# Patient Record
Sex: Female | Born: 1935 | Race: White | Hispanic: No | Smoking: Never smoker
Health system: Southern US, Community
[De-identification: ages and names within clinical notes are randomized; demographics above are authoritative.]

## PROBLEM LIST (undated history)

## (undated) DIAGNOSIS — F329 Major depressive disorder, single episode, unspecified: Secondary | ICD-10-CM

## (undated) DIAGNOSIS — C9000 Multiple myeloma not having achieved remission: Secondary | ICD-10-CM

## (undated) DIAGNOSIS — K219 Gastro-esophageal reflux disease without esophagitis: Secondary | ICD-10-CM

## (undated) DIAGNOSIS — F419 Anxiety disorder, unspecified: Secondary | ICD-10-CM

## (undated) DIAGNOSIS — E876 Hypokalemia: Secondary | ICD-10-CM

## (undated) DIAGNOSIS — J449 Chronic obstructive pulmonary disease, unspecified: Secondary | ICD-10-CM

## (undated) DIAGNOSIS — M8580 Other specified disorders of bone density and structure, unspecified site: Secondary | ICD-10-CM

## (undated) DIAGNOSIS — M1990 Unspecified osteoarthritis, unspecified site: Secondary | ICD-10-CM

## (undated) DIAGNOSIS — I4891 Unspecified atrial fibrillation: Secondary | ICD-10-CM

## (undated) DIAGNOSIS — R350 Frequency of micturition: Secondary | ICD-10-CM

## (undated) DIAGNOSIS — E785 Hyperlipidemia, unspecified: Secondary | ICD-10-CM

## (undated) DIAGNOSIS — F32A Depression, unspecified: Secondary | ICD-10-CM

## (undated) HISTORY — PX: THYROIDECTOMY, PARTIAL: SHX18

## (undated) HISTORY — DX: Hypokalemia: E87.6

## (undated) HISTORY — PX: TONSILLECTOMY: SUR1361

## (undated) HISTORY — DX: Major depressive disorder, single episode, unspecified: F32.9

## (undated) HISTORY — PX: APPENDECTOMY: SHX54

## (undated) HISTORY — DX: Other specified disorders of bone density and structure, unspecified site: M85.80

## (undated) HISTORY — DX: Depression, unspecified: F32.A

## (undated) HISTORY — DX: Hyperlipidemia, unspecified: E78.5

## (undated) HISTORY — DX: Unspecified atrial fibrillation: I48.91

## (undated) HISTORY — DX: Chronic obstructive pulmonary disease, unspecified: J44.9

---

## 2003-03-22 ENCOUNTER — Encounter: Admission: RE | Admit: 2003-03-22 | Discharge: 2003-03-22 | Payer: Self-pay

## 2009-03-07 ENCOUNTER — Ambulatory Visit: Payer: Self-pay | Admitting: Oncology

## 2009-03-09 LAB — CBC WITH DIFFERENTIAL/PLATELET
Basophils Absolute: 0 10*3/uL (ref 0.0–0.1)
EOS%: 2.1 % (ref 0.0–7.0)
HCT: 41.8 % (ref 34.8–46.6)
HGB: 13.9 g/dL (ref 11.6–15.9)
LYMPH%: 25.8 % (ref 14.0–49.7)
MCH: 28.9 pg (ref 25.1–34.0)
MCHC: 33.3 g/dL (ref 31.5–36.0)
MONO#: 0.7 10*3/uL (ref 0.1–0.9)
NEUT%: 63 % (ref 38.4–76.8)
Platelets: 276 10*3/uL (ref 145–400)
lymph#: 2.1 10*3/uL (ref 0.9–3.3)

## 2009-03-13 LAB — SPEP & IFE WITH QIG
Albumin ELP: 46.4 % — ABNORMAL LOW (ref 55.8–66.1)
Alpha-1-Globulin: 4 % (ref 2.9–4.9)
Beta 2: 4.6 % (ref 3.2–6.5)
Beta Globulin: 6 % (ref 4.7–7.2)
IgA: 334 mg/dL (ref 68–378)

## 2009-03-13 LAB — COMPREHENSIVE METABOLIC PANEL
Albumin: 3.9 g/dL (ref 3.5–5.2)
Alkaline Phosphatase: 70 U/L (ref 39–117)
BUN: 12 mg/dL (ref 6–23)
Creatinine, Ser: 0.62 mg/dL (ref 0.40–1.20)
Glucose, Bld: 129 mg/dL — ABNORMAL HIGH (ref 70–99)
Potassium: 3.7 mEq/L (ref 3.5–5.3)

## 2009-03-13 LAB — KAPPA/LAMBDA LIGHT CHAINS
Kappa free light chain: 4.18 mg/dL — ABNORMAL HIGH (ref 0.33–1.94)
Kappa:Lambda Ratio: 2.9 — ABNORMAL HIGH (ref 0.26–1.65)
Lambda Free Lght Chn: 1.44 mg/dL (ref 0.57–2.63)

## 2009-03-17 ENCOUNTER — Other Ambulatory Visit: Admission: RE | Admit: 2009-03-17 | Discharge: 2009-03-17 | Payer: Self-pay | Admitting: Oncology

## 2009-03-17 ENCOUNTER — Encounter: Payer: Self-pay | Admitting: Oncology

## 2009-03-17 ENCOUNTER — Ambulatory Visit (HOSPITAL_COMMUNITY): Admission: RE | Admit: 2009-03-17 | Discharge: 2009-03-17 | Payer: Self-pay | Admitting: Oncology

## 2009-03-17 LAB — PROTIME-INR: INR: 1.3 — ABNORMAL LOW (ref 2.00–3.50)

## 2009-03-17 LAB — CBC WITH DIFFERENTIAL/PLATELET
Basophils Absolute: 0 10*3/uL (ref 0.0–0.1)
EOS%: 1.7 % (ref 0.0–7.0)
Eosinophils Absolute: 0.2 10*3/uL (ref 0.0–0.5)
HCT: 39.2 % (ref 34.8–46.6)
HGB: 13.1 g/dL (ref 11.6–15.9)
MCH: 29 pg (ref 25.1–34.0)
MONO#: 1.2 10*3/uL — ABNORMAL HIGH (ref 0.1–0.9)
NEUT#: 6.8 10*3/uL — ABNORMAL HIGH (ref 1.5–6.5)
NEUT%: 62.9 % (ref 38.4–76.8)
RDW: 16.4 % — ABNORMAL HIGH (ref 11.2–14.5)
lymph#: 2.6 10*3/uL (ref 0.9–3.3)

## 2009-03-24 LAB — PROTIME-INR: INR: 1.4 — ABNORMAL LOW (ref 2.00–3.50)

## 2009-03-28 LAB — UIFE/LIGHT CHAINS/TP QN, 24-HR UR
Free Kappa Lt Chains,Ur: 8.82 mg/dL — ABNORMAL HIGH (ref 0.04–1.51)
Free Kappa/Lambda Ratio: 40.09 ratio — ABNORMAL HIGH (ref 0.46–4.00)
Free Lambda Lt Chains,Ur: 0.22 mg/dL (ref 0.08–1.01)
Free Lt Chn Excr Rate: 176.4 mg/d
Time: 24 hours
Total Protein, Urine-Ur/day: 194 mg/d — ABNORMAL HIGH (ref 10–140)
Total Protein, Urine: 9.7 mg/dL
Volume, Urine: 2000 mL

## 2009-03-30 LAB — PROTIME-INR

## 2009-04-24 ENCOUNTER — Ambulatory Visit: Payer: Self-pay | Admitting: Oncology

## 2009-04-28 LAB — CBC WITH DIFFERENTIAL/PLATELET
Basophils Absolute: 0 10*3/uL (ref 0.0–0.1)
EOS%: 2.1 % (ref 0.0–7.0)
HCT: 40.9 % (ref 34.8–46.6)
HGB: 14.1 g/dL (ref 11.6–15.9)
MCH: 30.3 pg (ref 25.1–34.0)
NEUT%: 60.9 % (ref 38.4–76.8)
Platelets: 262 10*3/uL (ref 145–400)
lymph#: 2 10*3/uL (ref 0.9–3.3)

## 2009-04-28 LAB — BASIC METABOLIC PANEL
BUN: 13 mg/dL (ref 6–23)
CO2: 24 mEq/L (ref 19–32)
Chloride: 102 mEq/L (ref 96–112)
Creatinine, Ser: 0.64 mg/dL (ref 0.40–1.20)

## 2009-05-04 ENCOUNTER — Encounter: Admission: RE | Admit: 2009-05-04 | Discharge: 2009-05-04 | Payer: Self-pay | Admitting: Oncology

## 2009-05-05 ENCOUNTER — Ambulatory Visit (HOSPITAL_COMMUNITY): Admission: RE | Admit: 2009-05-05 | Discharge: 2009-05-05 | Payer: Self-pay | Admitting: Oncology

## 2009-05-11 ENCOUNTER — Ambulatory Visit (HOSPITAL_COMMUNITY): Admission: RE | Admit: 2009-05-11 | Discharge: 2009-05-11 | Payer: Self-pay | Admitting: Oncology

## 2009-06-09 ENCOUNTER — Ambulatory Visit: Payer: Self-pay | Admitting: Oncology

## 2009-06-13 LAB — CBC WITH DIFFERENTIAL/PLATELET
Basophils Absolute: 0.1 10*3/uL (ref 0.0–0.1)
EOS%: 1.8 % (ref 0.0–7.0)
HCT: 41.1 % (ref 34.8–46.6)
HGB: 13.7 g/dL (ref 11.6–15.9)
LYMPH%: 27.4 % (ref 14.0–49.7)
MCH: 30 pg (ref 25.1–34.0)
MCHC: 33.3 g/dL (ref 31.5–36.0)
MCV: 90.1 fL (ref 79.5–101.0)
MONO%: 14.3 % — ABNORMAL HIGH (ref 0.0–14.0)
NEUT%: 55.7 % (ref 38.4–76.8)
Platelets: 227 10*3/uL (ref 145–400)

## 2009-06-13 LAB — COMPREHENSIVE METABOLIC PANEL
AST: 31 U/L (ref 0–37)
Alkaline Phosphatase: 62 U/L (ref 39–117)
BUN: 14 mg/dL (ref 6–23)
Calcium: 9 mg/dL (ref 8.4–10.5)
Chloride: 102 mEq/L (ref 96–112)
Creatinine, Ser: 0.66 mg/dL (ref 0.40–1.20)
Total Bilirubin: 0.5 mg/dL (ref 0.3–1.2)

## 2009-07-11 ENCOUNTER — Ambulatory Visit: Payer: Self-pay | Admitting: Oncology

## 2009-07-13 LAB — CBC WITH DIFFERENTIAL/PLATELET
Basophils Absolute: 0 10*3/uL (ref 0.0–0.1)
Eosinophils Absolute: 0.1 10*3/uL (ref 0.0–0.5)
HCT: 42 % (ref 34.8–46.6)
HGB: 14.3 g/dL (ref 11.6–15.9)
LYMPH%: 22.7 % (ref 14.0–49.7)
MCV: 92 fL (ref 79.5–101.0)
MONO#: 0.8 10*3/uL (ref 0.1–0.9)
MONO%: 8.7 % (ref 0.0–14.0)
NEUT#: 5.8 10*3/uL (ref 1.5–6.5)
Platelets: 267 10*3/uL (ref 145–400)
RBC: 4.56 10*6/uL (ref 3.70–5.45)
WBC: 8.7 10*3/uL (ref 3.9–10.3)

## 2009-07-18 LAB — COMPREHENSIVE METABOLIC PANEL
AST: 42 U/L — ABNORMAL HIGH (ref 0–37)
Albumin: 4 g/dL (ref 3.5–5.2)
Alkaline Phosphatase: 63 U/L (ref 39–117)
Calcium: 9 mg/dL (ref 8.4–10.5)
Chloride: 99 mEq/L (ref 96–112)
Potassium: 3.7 mEq/L (ref 3.5–5.3)
Sodium: 134 mEq/L — ABNORMAL LOW (ref 135–145)
Total Protein: 9.2 g/dL — ABNORMAL HIGH (ref 6.0–8.3)

## 2009-07-18 LAB — SPEP & IFE WITH QIG
Albumin ELP: 43.7 % — ABNORMAL LOW (ref 55.8–66.1)
Alpha-2-Globulin: 11.6 % (ref 7.1–11.8)
IgA: 277 mg/dL (ref 68–378)
IgG (Immunoglobin G), Serum: 3950 mg/dL — ABNORMAL HIGH (ref 694–1618)
M-Spike, %: 2.42 g/dL
Total Protein, Serum Electrophoresis: 9.2 g/dL — ABNORMAL HIGH (ref 6.0–8.3)

## 2009-08-02 LAB — CBC WITH DIFFERENTIAL/PLATELET
BASO%: 0.3 % (ref 0.0–2.0)
EOS%: 1.6 % (ref 0.0–7.0)
Eosinophils Absolute: 0.2 10*3/uL (ref 0.0–0.5)
LYMPH%: 20.8 % (ref 14.0–49.7)
MCHC: 33.9 g/dL (ref 31.5–36.0)
MCV: 91.8 fL (ref 79.5–101.0)
MONO%: 9.5 % (ref 0.0–14.0)
NEUT#: 6.7 10*3/uL — ABNORMAL HIGH (ref 1.5–6.5)
Platelets: 261 10*3/uL (ref 145–400)
RBC: 4.43 10*6/uL (ref 3.70–5.45)
lymph#: 2.1 10*3/uL (ref 0.9–3.3)

## 2009-08-04 LAB — PROTEIN ELECTROPHORESIS, SERUM
Alpha-2-Globulin: 10.8 % (ref 7.1–11.8)
Beta 2: 3.4 % (ref 3.2–6.5)
M-Spike, %: 2.64 g/dL
Total Protein, Serum Electrophoresis: 9.6 g/dL — ABNORMAL HIGH (ref 6.0–8.3)

## 2009-08-04 LAB — COMPREHENSIVE METABOLIC PANEL
Alkaline Phosphatase: 62 U/L (ref 39–117)
Chloride: 98 mEq/L (ref 96–112)
Creatinine, Ser: 0.58 mg/dL (ref 0.40–1.20)
Glucose, Bld: 124 mg/dL — ABNORMAL HIGH (ref 70–99)
Potassium: 3.6 mEq/L (ref 3.5–5.3)
Sodium: 133 mEq/L — ABNORMAL LOW (ref 135–145)
Total Bilirubin: 0.5 mg/dL (ref 0.3–1.2)

## 2009-08-04 LAB — KAPPA/LAMBDA LIGHT CHAINS
Kappa free light chain: 6.22 mg/dL — ABNORMAL HIGH (ref 0.33–1.94)
Kappa:Lambda Ratio: 6.04 — ABNORMAL HIGH (ref 0.26–1.65)

## 2009-09-11 ENCOUNTER — Ambulatory Visit: Payer: Self-pay | Admitting: Oncology

## 2009-09-11 LAB — CBC WITH DIFFERENTIAL/PLATELET
BASO%: 0.5 % (ref 0.0–2.0)
EOS%: 3.9 % (ref 0.0–7.0)
MCH: 31.8 pg (ref 25.1–34.0)
MCHC: 34 g/dL (ref 31.5–36.0)
MONO%: 19.2 % — ABNORMAL HIGH (ref 0.0–14.0)
RDW: 14.6 % — ABNORMAL HIGH (ref 11.2–14.5)
lymph#: 1.5 10*3/uL (ref 0.9–3.3)

## 2009-09-13 LAB — COMPREHENSIVE METABOLIC PANEL
ALT: 36 U/L — ABNORMAL HIGH (ref 0–35)
AST: 22 U/L (ref 0–37)
Albumin: 3.6 g/dL (ref 3.5–5.2)
Alkaline Phosphatase: 61 U/L (ref 39–117)
Calcium: 8.8 mg/dL (ref 8.4–10.5)
Chloride: 97 mEq/L (ref 96–112)
Creatinine, Ser: 0.68 mg/dL (ref 0.40–1.20)
Potassium: 3.6 mEq/L (ref 3.5–5.3)

## 2009-09-13 LAB — KAPPA/LAMBDA LIGHT CHAINS: Kappa:Lambda Ratio: 0.6 (ref 0.26–1.65)

## 2009-09-13 LAB — PROTEIN ELECTROPHORESIS, SERUM
Albumin ELP: 42.4 % — ABNORMAL LOW (ref 55.8–66.1)
Alpha-2-Globulin: 18.1 % — ABNORMAL HIGH (ref 7.1–11.8)
Beta 2: 6.2 % (ref 3.2–6.5)
Beta Globulin: 7.1 % (ref 4.7–7.2)

## 2009-10-09 LAB — CBC WITH DIFFERENTIAL/PLATELET
BASO%: 0.1 % (ref 0.0–2.0)
EOS%: 2.2 % (ref 0.0–7.0)
LYMPH%: 8.2 % — ABNORMAL LOW (ref 14.0–49.7)
NEUT%: 82.9 % — ABNORMAL HIGH (ref 38.4–76.8)
Platelets: 253 10*3/uL (ref 145–400)
RBC: 4.61 10*6/uL (ref 3.70–5.45)
RDW: 15.1 % — ABNORMAL HIGH (ref 11.2–14.5)
WBC: 6.4 10*3/uL (ref 3.9–10.3)

## 2009-10-11 ENCOUNTER — Ambulatory Visit: Payer: Self-pay | Admitting: Oncology

## 2009-10-11 LAB — IGG, IGA, IGM
IgA: 369 mg/dL (ref 68–378)
IgG (Immunoglobin G), Serum: 829 mg/dL (ref 694–1618)

## 2009-10-11 LAB — KAPPA/LAMBDA LIGHT CHAINS: Lambda Free Lght Chn: 1.89 mg/dL (ref 0.57–2.63)

## 2009-10-11 LAB — PROTEIN ELECTROPHORESIS, SERUM
Beta Globulin: 7.6 % — ABNORMAL HIGH (ref 4.7–7.2)
Gamma Globulin: 12.2 % (ref 11.1–18.8)
Total Protein, Serum Electrophoresis: 6.7 g/dL (ref 6.0–8.3)

## 2009-10-11 LAB — COMPREHENSIVE METABOLIC PANEL
AST: 21 U/L (ref 0–37)
Albumin: 3.8 g/dL (ref 3.5–5.2)
Alkaline Phosphatase: 57 U/L (ref 39–117)
BUN: 9 mg/dL (ref 6–23)
Chloride: 95 mEq/L — ABNORMAL LOW (ref 96–112)
Potassium: 3.4 mEq/L — ABNORMAL LOW (ref 3.5–5.3)
Total Bilirubin: 0.7 mg/dL (ref 0.3–1.2)
Total Protein: 6.7 g/dL (ref 6.0–8.3)

## 2009-11-06 LAB — MANUAL DIFFERENTIAL
ALC: 1.5 10*3/uL (ref 0.9–3.3)
ANC (CHCC manual diff): 4.1 10*3/uL (ref 1.5–6.5)
Blasts: 0 % (ref 0–0)
LYMPH: 18 % (ref 14–49)
MONO: 31 % — ABNORMAL HIGH (ref 0–14)
Myelocytes: 0 % (ref 0–0)
Other Cell: 0 % (ref 0–0)
SEG: 49 % (ref 38–77)

## 2009-11-06 LAB — CBC WITH DIFFERENTIAL/PLATELET
HCT: 38.4 % (ref 34.8–46.6)
HGB: 13.2 g/dL (ref 11.6–15.9)
MCHC: 34.4 g/dL (ref 31.5–36.0)
Platelets: 278 10*3/uL (ref 145–400)
WBC: 8.4 10*3/uL (ref 3.9–10.3)

## 2009-11-08 LAB — KAPPA/LAMBDA LIGHT CHAINS
Kappa free light chain: 0.66 mg/dL (ref 0.33–1.94)
Kappa:Lambda Ratio: 0.54 (ref 0.26–1.65)
Lambda Free Lght Chn: 1.22 mg/dL (ref 0.57–2.63)

## 2009-11-08 LAB — COMPREHENSIVE METABOLIC PANEL WITH GFR
ALT: 42 U/L — ABNORMAL HIGH (ref 0–35)
AST: 32 U/L (ref 0–37)
Albumin: 3.4 g/dL — ABNORMAL LOW (ref 3.5–5.2)
Alkaline Phosphatase: 49 U/L (ref 39–117)
BUN: 15 mg/dL (ref 6–23)
CO2: 29 meq/L (ref 19–32)
Calcium: 8 mg/dL — ABNORMAL LOW (ref 8.4–10.5)
Chloride: 97 meq/L (ref 96–112)
Creatinine, Ser: 0.67 mg/dL (ref 0.40–1.20)
Glucose, Bld: 135 mg/dL — ABNORMAL HIGH (ref 70–99)
Potassium: 2.9 meq/L — ABNORMAL LOW (ref 3.5–5.3)
Sodium: 136 meq/L (ref 135–145)
Total Bilirubin: 0.6 mg/dL (ref 0.3–1.2)
Total Protein: 6.1 g/dL (ref 6.0–8.3)

## 2009-11-08 LAB — IGG, IGA, IGM
IgA: 265 mg/dL (ref 68–378)
IgG (Immunoglobin G), Serum: 591 mg/dL — ABNORMAL LOW (ref 694–1618)
IgM, Serum: 68 mg/dL (ref 60–263)

## 2009-11-08 LAB — PROTEIN ELECTROPHORESIS, SERUM
Albumin ELP: 49.6 % — ABNORMAL LOW (ref 55.8–66.1)
Alpha-1-Globulin: 10.8 % — ABNORMAL HIGH (ref 2.9–4.9)
Beta Globulin: 7 % (ref 4.7–7.2)
Gamma Globulin: 10.2 % — ABNORMAL LOW (ref 11.1–18.8)
M-Spike, %: 0.18 g/dL
Total Protein, Serum Electrophoresis: 6.1 g/dL (ref 6.0–8.3)

## 2009-11-10 ENCOUNTER — Ambulatory Visit: Payer: Self-pay | Admitting: Oncology

## 2009-12-04 LAB — CBC WITH DIFFERENTIAL/PLATELET
EOS%: 4.3 % (ref 0.0–7.0)
Eosinophils Absolute: 0.4 10*3/uL (ref 0.0–0.5)
HGB: 14.1 g/dL (ref 11.6–15.9)
MCV: 93.3 fL (ref 79.5–101.0)
MONO%: 18.9 % — ABNORMAL HIGH (ref 0.0–14.0)
NEUT#: 5.1 10*3/uL (ref 1.5–6.5)
Platelets: 214 10*3/uL (ref 145–400)
RDW: 17.1 % — ABNORMAL HIGH (ref 11.2–14.5)

## 2009-12-08 LAB — COMPREHENSIVE METABOLIC PANEL
ALT: 44 U/L — ABNORMAL HIGH (ref 0–35)
Albumin: 3.6 g/dL (ref 3.5–5.2)
BUN: 12 mg/dL (ref 6–23)
Chloride: 98 mEq/L (ref 96–112)
Total Protein: 5.9 g/dL — ABNORMAL LOW (ref 6.0–8.3)

## 2009-12-08 LAB — PROTEIN ELECTROPHORESIS, SERUM
Albumin ELP: 55.2 % — ABNORMAL LOW (ref 55.8–66.1)
Alpha-2-Globulin: 18.4 % — ABNORMAL HIGH (ref 7.1–11.8)
Beta 2: 5.7 % (ref 3.2–6.5)
Beta Globulin: 7.1 % (ref 4.7–7.2)

## 2009-12-08 LAB — KAPPA/LAMBDA LIGHT CHAINS
Kappa free light chain: <0.54 mg/dL (ref 0.33–1.94)
Lambda Free Lght Chn: 0.61 mg/dL (ref 0.57–2.63)

## 2009-12-28 ENCOUNTER — Ambulatory Visit: Payer: Self-pay | Admitting: Oncology

## 2010-01-01 LAB — CBC WITH DIFFERENTIAL/PLATELET
BASO%: 0.1 % (ref 0.0–2.0)
HGB: 14 g/dL (ref 11.6–15.9)
LYMPH%: 16.3 % (ref 14.0–49.7)
MONO#: 2.3 10*3/uL — ABNORMAL HIGH (ref 0.1–0.9)
NEUT#: 4.2 10*3/uL (ref 1.5–6.5)
Platelets: 230 10*3/uL (ref 145–400)
RBC: 4.46 10*6/uL (ref 3.70–5.45)
RDW: 15.8 % — ABNORMAL HIGH (ref 11.2–14.5)

## 2010-01-03 LAB — PROTEIN ELECTROPHORESIS, SERUM
Alpha-2-Globulin: 19.3 % — ABNORMAL HIGH (ref 7.1–11.8)
Beta 2: 5.5 % (ref 3.2–6.5)
Beta Globulin: 7.2 % (ref 4.7–7.2)
Gamma Globulin: 7.9 % — ABNORMAL LOW (ref 11.1–18.8)

## 2010-01-03 LAB — KAPPA/LAMBDA LIGHT CHAINS
Kappa free light chain: 0.58 mg/dL (ref 0.33–1.94)
Lambda Free Lght Chn: 2.22 mg/dL (ref 0.57–2.63)

## 2010-01-03 LAB — COMPREHENSIVE METABOLIC PANEL
ALT: 44 U/L — ABNORMAL HIGH (ref 0–35)
Albumin: 3.6 g/dL (ref 3.5–5.2)
CO2: 27 mEq/L (ref 19–32)
Calcium: 8.4 mg/dL (ref 8.4–10.5)
Creatinine, Ser: 0.57 mg/dL (ref 0.40–1.20)
Sodium: 137 mEq/L (ref 135–145)
Total Protein: 6.2 g/dL (ref 6.0–8.3)

## 2010-01-29 ENCOUNTER — Ambulatory Visit: Payer: Self-pay | Admitting: Oncology

## 2010-01-29 LAB — CBC WITH DIFFERENTIAL/PLATELET
BASO%: 0.7 % (ref 0.0–2.0)
Basophils Absolute: 0 10*3/uL (ref 0.0–0.1)
EOS%: 4 % (ref 0.0–7.0)
Eosinophils Absolute: 0.3 10*3/uL (ref 0.0–0.5)
LYMPH%: 28 % (ref 14.0–49.7)
MCH: 31.8 pg (ref 25.1–34.0)
MONO#: 1.3 10*3/uL — ABNORMAL HIGH (ref 0.1–0.9)
MONO%: 19.6 % — ABNORMAL HIGH (ref 0.0–14.0)
NEUT#: 3.1 10*3/uL (ref 1.5–6.5)
NEUT%: 47.7 % (ref 38.4–76.8)
WBC: 6.4 10*3/uL (ref 3.9–10.3)
lymph#: 1.8 10*3/uL (ref 0.9–3.3)

## 2010-01-31 LAB — COMPREHENSIVE METABOLIC PANEL
ALT: 17 U/L (ref 0–35)
AST: 20 U/L (ref 0–37)
Alkaline Phosphatase: 52 U/L (ref 39–117)
Creatinine, Ser: 0.6 mg/dL (ref 0.40–1.20)
Potassium: 3.2 mEq/L — ABNORMAL LOW (ref 3.5–5.3)
Sodium: 141 mEq/L (ref 135–145)
Total Bilirubin: 0.7 mg/dL (ref 0.3–1.2)

## 2010-01-31 LAB — PROTEIN ELECTROPHORESIS, SERUM
Alpha-2-Globulin: 16.5 % — ABNORMAL HIGH (ref 7.1–11.8)
Beta 2: 6.9 % — ABNORMAL HIGH (ref 3.2–6.5)
Beta Globulin: 7.4 % — ABNORMAL HIGH (ref 4.7–7.2)
Gamma Globulin: 12 % (ref 11.1–18.8)
Total Protein, Serum Electrophoresis: 6.3 g/dL (ref 6.0–8.3)

## 2010-01-31 LAB — KAPPA/LAMBDA LIGHT CHAINS: Lambda Free Lght Chn: 1.35 mg/dL (ref 0.57–2.63)

## 2010-02-02 ENCOUNTER — Ambulatory Visit (HOSPITAL_COMMUNITY): Admission: RE | Admit: 2010-02-02 | Discharge: 2010-02-02 | Payer: Self-pay | Admitting: Oncology

## 2010-02-26 LAB — CBC WITH DIFFERENTIAL/PLATELET
BASO%: 0.4 % (ref 0.0–2.0)
Basophils Absolute: 0 10*3/uL (ref 0.0–0.1)
Eosinophils Absolute: 0.3 10*3/uL (ref 0.0–0.5)
HGB: 13.8 g/dL (ref 11.6–15.9)
LYMPH%: 27.1 % (ref 14.0–49.7)
MCV: 90.2 fL (ref 79.5–101.0)
MONO%: 19.5 % — ABNORMAL HIGH (ref 0.0–14.0)
NEUT#: 3.6 10*3/uL (ref 1.5–6.5)
NEUT%: 48.8 % (ref 38.4–76.8)
Platelets: 212 10*3/uL (ref 145–400)
RDW: 14.9 % — ABNORMAL HIGH (ref 11.2–14.5)

## 2010-02-28 ENCOUNTER — Ambulatory Visit: Payer: Self-pay | Admitting: Oncology

## 2010-03-02 LAB — COMPREHENSIVE METABOLIC PANEL
ALT: 12 U/L (ref 0–35)
AST: 17 U/L (ref 0–37)
Albumin: 3.6 g/dL (ref 3.5–5.2)
Alkaline Phosphatase: 47 U/L (ref 39–117)
BUN: 12 mg/dL (ref 6–23)
Creatinine, Ser: 0.65 mg/dL (ref 0.40–1.20)
Total Protein: 6.3 g/dL (ref 6.0–8.3)

## 2010-03-02 LAB — PROTEIN ELECTROPHORESIS, SERUM
Albumin ELP: 48.9 % — ABNORMAL LOW (ref 55.8–66.1)
Alpha-2-Globulin: 17.5 % — ABNORMAL HIGH (ref 7.1–11.8)
Beta 2: 6 % (ref 3.2–6.5)
Gamma Globulin: 13.1 % (ref 11.1–18.8)

## 2010-03-02 LAB — KAPPA/LAMBDA LIGHT CHAINS: Kappa:Lambda Ratio: 0.7 (ref 0.26–1.65)

## 2010-03-26 LAB — CBC WITH DIFFERENTIAL/PLATELET
Basophils Absolute: 0 10*3/uL (ref 0.0–0.1)
LYMPH%: 22.7 % (ref 14.0–49.7)
NEUT#: 4.2 10*3/uL (ref 1.5–6.5)
RBC: 4.28 10*6/uL (ref 3.70–5.45)
lymph#: 1.7 10*3/uL (ref 0.9–3.3)

## 2010-03-28 LAB — KAPPA/LAMBDA LIGHT CHAINS
Kappa free light chain: 1.97 mg/dL — ABNORMAL HIGH (ref 0.33–1.94)
Kappa:Lambda Ratio: 0.89 (ref 0.26–1.65)
Lambda Free Lght Chn: 2.22 mg/dL (ref 0.57–2.63)

## 2010-03-28 LAB — COMPREHENSIVE METABOLIC PANEL
ALT: 12 U/L (ref 0–35)
Albumin: 3.7 g/dL (ref 3.5–5.2)
BUN: 8 mg/dL (ref 6–23)
CO2: 26 mEq/L (ref 19–32)
Creatinine, Ser: 0.72 mg/dL (ref 0.40–1.20)
Glucose, Bld: 79 mg/dL (ref 70–99)
Potassium: 3.3 mEq/L — ABNORMAL LOW (ref 3.5–5.3)
Sodium: 138 mEq/L (ref 135–145)
Total Bilirubin: 0.8 mg/dL (ref 0.3–1.2)
Total Protein: 6.5 g/dL (ref 6.0–8.3)

## 2010-03-28 LAB — PROTEIN ELECTROPHORESIS, SERUM
Albumin ELP: 47.9 % — ABNORMAL LOW (ref 55.8–66.1)
Alpha-1-Globulin: 6.3 % — ABNORMAL HIGH (ref 2.9–4.9)
Alpha-2-Globulin: 14.6 % — ABNORMAL HIGH (ref 7.1–11.8)
Beta 2: 7.1 % — ABNORMAL HIGH (ref 3.2–6.5)
Gamma Globulin: 17.5 % (ref 11.1–18.8)
Total Protein, Serum Electrophoresis: 6.9 g/dL (ref 6.0–8.3)

## 2010-03-30 ENCOUNTER — Ambulatory Visit: Payer: Self-pay | Admitting: Oncology

## 2010-04-23 LAB — CBC WITH DIFFERENTIAL/PLATELET
BASO%: 0.4 % (ref 0.0–2.0)
EOS%: 4.5 % (ref 0.0–7.0)
HCT: 37.7 % (ref 34.8–46.6)
HGB: 12.7 g/dL (ref 11.6–15.9)
MONO#: 1.2 10*3/uL — ABNORMAL HIGH (ref 0.1–0.9)
MONO%: 19.1 % — ABNORMAL HIGH (ref 0.0–14.0)
NEUT#: 3 10*3/uL (ref 1.5–6.5)

## 2010-04-25 LAB — PROTEIN ELECTROPHORESIS, SERUM
Albumin ELP: 45.6 % — ABNORMAL LOW (ref 55.8–66.1)
Alpha-2-Globulin: 15.1 % — ABNORMAL HIGH (ref 7.1–11.8)
Beta Globulin: 6.5 % (ref 4.7–7.2)
Gamma Globulin: 19.6 % — ABNORMAL HIGH (ref 11.1–18.8)
Total Protein, Serum Electrophoresis: 6.9 g/dL (ref 6.0–8.3)

## 2010-04-25 LAB — COMPREHENSIVE METABOLIC PANEL
ALT: 12 U/L (ref 0–35)
Albumin: 3.7 g/dL (ref 3.5–5.2)
BUN: 9 mg/dL (ref 6–23)
Potassium: 3.3 mEq/L — ABNORMAL LOW (ref 3.5–5.3)
Sodium: 137 mEq/L (ref 135–145)
Total Bilirubin: 0.5 mg/dL (ref 0.3–1.2)
Total Protein: 6.9 g/dL (ref 6.0–8.3)

## 2010-05-17 ENCOUNTER — Ambulatory Visit: Payer: Self-pay | Admitting: Oncology

## 2010-05-21 LAB — CBC WITH DIFFERENTIAL/PLATELET
Basophils Absolute: 0 10*3/uL (ref 0.0–0.1)
EOS%: 4.4 % (ref 0.0–7.0)
Eosinophils Absolute: 0.3 10*3/uL (ref 0.0–0.5)
HCT: 38.6 % (ref 34.8–46.6)
HGB: 12.7 g/dL (ref 11.6–15.9)
MCH: 28.5 pg (ref 25.1–34.0)
MONO#: 1.3 10*3/uL — ABNORMAL HIGH (ref 0.1–0.9)
NEUT#: 3.2 10*3/uL (ref 1.5–6.5)
NEUT%: 45.8 % (ref 38.4–76.8)
RDW: 17.1 % — ABNORMAL HIGH (ref 11.2–14.5)
lymph#: 2.1 10*3/uL (ref 0.9–3.3)

## 2010-05-23 LAB — COMPREHENSIVE METABOLIC PANEL
Albumin: 3.8 g/dL (ref 3.5–5.2)
BUN: 11 mg/dL (ref 6–23)
CO2: 26 mEq/L (ref 19–32)
Calcium: 8.9 mg/dL (ref 8.4–10.5)
Chloride: 100 mEq/L (ref 96–112)
Glucose, Bld: 91 mg/dL (ref 70–99)
Potassium: 3.4 mEq/L — ABNORMAL LOW (ref 3.5–5.3)
Sodium: 137 mEq/L (ref 135–145)
Total Protein: 6.9 g/dL (ref 6.0–8.3)

## 2010-05-23 LAB — PROTEIN ELECTROPHORESIS, SERUM
Albumin ELP: 46.8 % — ABNORMAL LOW (ref 55.8–66.1)
Alpha-1-Globulin: 5.5 % — ABNORMAL HIGH (ref 2.9–4.9)
Beta 2: 7.1 % — ABNORMAL HIGH (ref 3.2–6.5)
Total Protein, Serum Electrophoresis: 6.9 g/dL (ref 6.0–8.3)

## 2010-05-23 LAB — KAPPA/LAMBDA LIGHT CHAINS
Kappa free light chain: 2.14 mg/dL — ABNORMAL HIGH (ref 0.33–1.94)
Kappa:Lambda Ratio: 1.24 (ref 0.26–1.65)

## 2010-06-18 ENCOUNTER — Ambulatory Visit: Payer: Self-pay | Admitting: Oncology

## 2010-07-17 LAB — CBC WITH DIFFERENTIAL/PLATELET
BASO%: 0.6 % (ref 0.0–2.0)
EOS%: 4 % (ref 0.0–7.0)
Eosinophils Absolute: 0.2 10*3/uL (ref 0.0–0.5)
HCT: 37.8 % (ref 34.8–46.6)
LYMPH%: 32.6 % (ref 14.0–49.7)
MONO#: 1 10*3/uL — ABNORMAL HIGH (ref 0.1–0.9)
NEUT#: 2.2 10*3/uL (ref 1.5–6.5)
NEUT%: 43.7 % (ref 38.4–76.8)
Platelets: 183 10*3/uL (ref 145–400)
RBC: 4.23 10*6/uL (ref 3.70–5.45)
lymph#: 1.7 10*3/uL (ref 0.9–3.3)

## 2010-07-19 LAB — KAPPA/LAMBDA LIGHT CHAINS
Kappa free light chain: 3.4 mg/dL — ABNORMAL HIGH (ref 0.33–1.94)
Kappa:Lambda Ratio: 1.85 — ABNORMAL HIGH (ref 0.26–1.65)
Lambda Free Lght Chn: 1.84 mg/dL (ref 0.57–2.63)

## 2010-07-19 LAB — COMPREHENSIVE METABOLIC PANEL
ALT: 13 U/L (ref 0–35)
AST: 22 U/L (ref 0–37)
Albumin: 3.9 g/dL (ref 3.5–5.2)
Alkaline Phosphatase: 54 U/L (ref 39–117)
BUN: 11 mg/dL (ref 6–23)
CO2: 26 mEq/L (ref 19–32)
Calcium: 8.8 mg/dL (ref 8.4–10.5)
Chloride: 101 mEq/L (ref 96–112)
Creatinine, Ser: 0.69 mg/dL (ref 0.40–1.20)
Glucose, Bld: 108 mg/dL — ABNORMAL HIGH (ref 70–99)
Potassium: 3.3 mEq/L — ABNORMAL LOW (ref 3.5–5.3)
Sodium: 138 mEq/L (ref 135–145)
Total Bilirubin: 0.6 mg/dL (ref 0.3–1.2)
Total Protein: 7.1 g/dL (ref 6.0–8.3)

## 2010-07-19 LAB — PROTEIN ELECTROPHORESIS, SERUM
Albumin ELP: 50 % — ABNORMAL LOW (ref 55.8–66.1)
Alpha-1-Globulin: 4.9 % (ref 2.9–4.9)
Alpha-2-Globulin: 12.5 % — ABNORMAL HIGH (ref 7.1–11.8)
Beta 2: 7 % — ABNORMAL HIGH (ref 3.2–6.5)
Beta Globulin: 7.1 % (ref 4.7–7.2)
Gamma Globulin: 18.5 % (ref 11.1–18.8)
Total Protein, Serum Electrophoresis: 7.1 g/dL (ref 6.0–8.3)

## 2010-07-20 ENCOUNTER — Ambulatory Visit: Payer: Self-pay | Admitting: Oncology

## 2010-08-13 LAB — CBC WITH DIFFERENTIAL/PLATELET
BASO%: 0.4 % (ref 0.0–2.0)
Basophils Absolute: 0 10*3/uL (ref 0.0–0.1)
Eosinophils Absolute: 0.2 10*3/uL (ref 0.0–0.5)
HGB: 13.2 g/dL (ref 11.6–15.9)
MCHC: 33.4 g/dL (ref 31.5–36.0)
MCV: 89.6 fL (ref 79.5–101.0)
MONO#: 1.2 10*3/uL — ABNORMAL HIGH (ref 0.1–0.9)
NEUT#: 2.9 10*3/uL (ref 1.5–6.5)
Platelets: 166 10*3/uL (ref 145–400)
RBC: 4.41 10*6/uL (ref 3.70–5.45)
lymph#: 1.7 10*3/uL (ref 0.9–3.3)

## 2010-08-16 LAB — COMPREHENSIVE METABOLIC PANEL
AST: 21 U/L (ref 0–37)
CO2: 24 mEq/L (ref 19–32)
Calcium: 8.6 mg/dL (ref 8.4–10.5)
Glucose, Bld: 87 mg/dL (ref 70–99)
Sodium: 136 mEq/L (ref 135–145)
Total Bilirubin: 0.7 mg/dL (ref 0.3–1.2)
Total Protein: 6.9 g/dL (ref 6.0–8.3)

## 2010-08-16 LAB — KAPPA/LAMBDA LIGHT CHAINS
Kappa free light chain: 2.3 mg/dL — ABNORMAL HIGH (ref 0.33–1.94)
Kappa:Lambda Ratio: 1.65 (ref 0.26–1.65)
Lambda Free Lght Chn: 1.39 mg/dL (ref 0.57–2.63)

## 2010-08-16 LAB — PROTEIN ELECTROPHORESIS, SERUM
Albumin ELP: 48.8 % — ABNORMAL LOW (ref 55.8–66.1)
Total Protein, Serum Electrophoresis: 6.9 g/dL (ref 6.0–8.3)

## 2010-08-16 LAB — LACTATE DEHYDROGENASE: LDH: 115 U/L (ref 94–250)

## 2010-08-22 ENCOUNTER — Encounter (HOSPITAL_BASED_OUTPATIENT_CLINIC_OR_DEPARTMENT_OTHER): Payer: Medicare Other | Admitting: Oncology

## 2010-08-22 DIAGNOSIS — E876 Hypokalemia: Secondary | ICD-10-CM

## 2010-08-22 DIAGNOSIS — M899 Disorder of bone, unspecified: Secondary | ICD-10-CM

## 2010-08-22 DIAGNOSIS — C9000 Multiple myeloma not having achieved remission: Secondary | ICD-10-CM

## 2010-09-10 ENCOUNTER — Other Ambulatory Visit: Payer: Self-pay | Admitting: Oncology

## 2010-09-10 ENCOUNTER — Encounter (HOSPITAL_BASED_OUTPATIENT_CLINIC_OR_DEPARTMENT_OTHER): Payer: Medicare Other | Admitting: Oncology

## 2010-09-10 DIAGNOSIS — C9000 Multiple myeloma not having achieved remission: Secondary | ICD-10-CM

## 2010-09-10 DIAGNOSIS — E876 Hypokalemia: Secondary | ICD-10-CM

## 2010-09-10 DIAGNOSIS — M949 Disorder of cartilage, unspecified: Secondary | ICD-10-CM

## 2010-09-10 LAB — CBC WITH DIFFERENTIAL/PLATELET
BASO%: 0.4 % (ref 0.0–2.0)
EOS%: 5.1 % (ref 0.0–7.0)
HCT: 38.9 % (ref 34.8–46.6)
LYMPH%: 34.1 % (ref 14.0–49.7)
MCH: 29.7 pg (ref 25.1–34.0)
MCHC: 33.1 g/dL (ref 31.5–36.0)
MONO#: 1.1 10*3/uL — ABNORMAL HIGH (ref 0.1–0.9)
NEUT%: 42 % (ref 38.4–76.8)
Platelets: 142 10*3/uL — ABNORMAL LOW (ref 145–400)
RBC: 4.34 10*6/uL (ref 3.70–5.45)
WBC: 5.8 10*3/uL (ref 3.9–10.3)
lymph#: 2 10*3/uL (ref 0.9–3.3)

## 2010-09-12 LAB — PROTEIN ELECTROPHORESIS, SERUM
Albumin ELP: 51.9 % — ABNORMAL LOW (ref 55.8–66.1)
Alpha-1-Globulin: 4.9 % (ref 2.9–4.9)
Alpha-2-Globulin: 12.7 % — ABNORMAL HIGH (ref 7.1–11.8)
Beta Globulin: 6.5 % (ref 4.7–7.2)
Total Protein, Serum Electrophoresis: 7 g/dL (ref 6.0–8.3)

## 2010-09-12 LAB — COMPREHENSIVE METABOLIC PANEL
ALT: 13 U/L (ref 0–35)
BUN: 11 mg/dL (ref 6–23)
CO2: 25 mEq/L (ref 19–32)
Creatinine, Ser: 0.76 mg/dL (ref 0.40–1.20)
Glucose, Bld: 101 mg/dL — ABNORMAL HIGH (ref 70–99)
Total Bilirubin: 0.6 mg/dL (ref 0.3–1.2)

## 2010-10-08 ENCOUNTER — Encounter (HOSPITAL_BASED_OUTPATIENT_CLINIC_OR_DEPARTMENT_OTHER): Payer: Medicare Other | Admitting: Oncology

## 2010-10-08 ENCOUNTER — Other Ambulatory Visit: Payer: Self-pay | Admitting: Oncology

## 2010-10-08 DIAGNOSIS — C9000 Multiple myeloma not having achieved remission: Secondary | ICD-10-CM

## 2010-10-08 DIAGNOSIS — E876 Hypokalemia: Secondary | ICD-10-CM

## 2010-10-08 DIAGNOSIS — M949 Disorder of cartilage, unspecified: Secondary | ICD-10-CM

## 2010-10-08 LAB — CBC WITH DIFFERENTIAL/PLATELET
Basophils Absolute: 0 10*3/uL (ref 0.0–0.1)
Eosinophils Absolute: 0.3 10*3/uL (ref 0.0–0.5)
HCT: 36 % (ref 34.8–46.6)
HGB: 12.1 g/dL (ref 11.6–15.9)
LYMPH%: 33.7 % (ref 14.0–49.7)
MCV: 88 fL (ref 79.5–101.0)
MONO%: 22.9 % — ABNORMAL HIGH (ref 0.0–14.0)
NEUT#: 2.2 10*3/uL (ref 1.5–6.5)
Platelets: 176 10*3/uL (ref 145–400)

## 2010-10-10 LAB — COMPREHENSIVE METABOLIC PANEL
CO2: 25 mEq/L (ref 19–32)
Calcium: 8.4 mg/dL (ref 8.4–10.5)
Chloride: 99 mEq/L (ref 96–112)
Creatinine, Ser: 0.67 mg/dL (ref 0.40–1.20)
Glucose, Bld: 86 mg/dL (ref 70–99)
Sodium: 137 mEq/L (ref 135–145)
Total Bilirubin: 0.7 mg/dL (ref 0.3–1.2)
Total Protein: 7 g/dL (ref 6.0–8.3)

## 2010-10-10 LAB — PROTEIN ELECTROPHORESIS, SERUM
Albumin ELP: 49.2 % — ABNORMAL LOW (ref 55.8–66.1)
Beta Globulin: 7 % (ref 4.7–7.2)
Total Protein, Serum Electrophoresis: 7 g/dL (ref 6.0–8.3)

## 2010-10-19 LAB — TISSUE HYBRIDIZATION (BONE MARROW)-NCBH

## 2010-10-19 LAB — BONE MARROW EXAM: Bone Marrow Exam: 311

## 2010-10-19 LAB — CHROMOSOME ANALYSIS, BONE MARROW

## 2010-11-05 ENCOUNTER — Encounter (HOSPITAL_BASED_OUTPATIENT_CLINIC_OR_DEPARTMENT_OTHER): Payer: Medicare Other | Admitting: Oncology

## 2010-11-05 ENCOUNTER — Other Ambulatory Visit: Payer: Self-pay | Admitting: Oncology

## 2010-11-05 DIAGNOSIS — C9000 Multiple myeloma not having achieved remission: Secondary | ICD-10-CM

## 2010-11-05 DIAGNOSIS — M949 Disorder of cartilage, unspecified: Secondary | ICD-10-CM

## 2010-11-05 DIAGNOSIS — E876 Hypokalemia: Secondary | ICD-10-CM

## 2010-11-05 LAB — CBC WITH DIFFERENTIAL/PLATELET
Basophils Absolute: 0 10*3/uL (ref 0.0–0.1)
EOS%: 4.6 % (ref 0.0–7.0)
Eosinophils Absolute: 0.2 10*3/uL (ref 0.0–0.5)
HCT: 36.5 % (ref 34.8–46.6)
HGB: 12.4 g/dL (ref 11.6–15.9)
MCH: 31 pg (ref 25.1–34.0)
NEUT#: 2.4 10*3/uL (ref 1.5–6.5)
NEUT%: 46.1 % (ref 38.4–76.8)
RDW: 17.2 % — ABNORMAL HIGH (ref 11.2–14.5)
lymph#: 1.7 10*3/uL (ref 0.9–3.3)

## 2010-11-07 LAB — COMPREHENSIVE METABOLIC PANEL
Albumin: 3.8 g/dL (ref 3.5–5.2)
BUN: 10 mg/dL (ref 6–23)
CO2: 25 mEq/L (ref 19–32)
Calcium: 9 mg/dL (ref 8.4–10.5)
Chloride: 97 mEq/L (ref 96–112)
Creatinine, Ser: 0.68 mg/dL (ref 0.40–1.20)
Glucose, Bld: 104 mg/dL — ABNORMAL HIGH (ref 70–99)
Potassium: 3.3 mEq/L — ABNORMAL LOW (ref 3.5–5.3)

## 2010-11-07 LAB — PROTEIN ELECTROPHORESIS, SERUM
Albumin ELP: 47.5 % — ABNORMAL LOW (ref 55.8–66.1)
Alpha-1-Globulin: 5.2 % — ABNORMAL HIGH (ref 2.9–4.9)
Alpha-2-Globulin: 13.2 % — ABNORMAL HIGH (ref 7.1–11.8)
Total Protein, Serum Electrophoresis: 7.1 g/dL (ref 6.0–8.3)

## 2010-11-12 ENCOUNTER — Encounter (HOSPITAL_BASED_OUTPATIENT_CLINIC_OR_DEPARTMENT_OTHER): Payer: Medicare Other | Admitting: Oncology

## 2010-11-12 DIAGNOSIS — E876 Hypokalemia: Secondary | ICD-10-CM

## 2010-11-12 DIAGNOSIS — M949 Disorder of cartilage, unspecified: Secondary | ICD-10-CM

## 2010-11-12 DIAGNOSIS — C9000 Multiple myeloma not having achieved remission: Secondary | ICD-10-CM

## 2010-12-03 ENCOUNTER — Encounter (HOSPITAL_BASED_OUTPATIENT_CLINIC_OR_DEPARTMENT_OTHER): Payer: Medicare Other | Admitting: Oncology

## 2010-12-03 ENCOUNTER — Other Ambulatory Visit: Payer: Self-pay | Admitting: Oncology

## 2010-12-03 DIAGNOSIS — M899 Disorder of bone, unspecified: Secondary | ICD-10-CM

## 2010-12-03 DIAGNOSIS — C9000 Multiple myeloma not having achieved remission: Secondary | ICD-10-CM

## 2010-12-03 DIAGNOSIS — E876 Hypokalemia: Secondary | ICD-10-CM

## 2010-12-03 LAB — CBC WITH DIFFERENTIAL/PLATELET
BASO%: 0.7 % (ref 0.0–2.0)
Eosinophils Absolute: 0.2 10*3/uL (ref 0.0–0.5)
HCT: 34.6 % — ABNORMAL LOW (ref 34.8–46.6)
LYMPH%: 33.1 % (ref 14.0–49.7)
MCHC: 33.5 g/dL (ref 31.5–36.0)
MONO#: 1.1 10*3/uL — ABNORMAL HIGH (ref 0.1–0.9)
NEUT#: 2.2 10*3/uL (ref 1.5–6.5)
NEUT%: 42.3 % (ref 38.4–76.8)
Platelets: 157 10*3/uL (ref 145–400)
WBC: 5.3 10*3/uL (ref 3.9–10.3)
lymph#: 1.7 10*3/uL (ref 0.9–3.3)

## 2010-12-05 LAB — PROTEIN ELECTROPHORESIS, SERUM
Albumin ELP: 46.7 % — ABNORMAL LOW (ref 55.8–66.1)
Total Protein, Serum Electrophoresis: 6.7 g/dL (ref 6.0–8.3)

## 2010-12-05 LAB — COMPREHENSIVE METABOLIC PANEL
ALT: 15 U/L (ref 0–35)
CO2: 26 mEq/L (ref 19–32)
Calcium: 8.5 mg/dL (ref 8.4–10.5)
Chloride: 100 mEq/L (ref 96–112)
Creatinine, Ser: 0.64 mg/dL (ref 0.40–1.20)
Glucose, Bld: 79 mg/dL (ref 70–99)
Sodium: 138 mEq/L (ref 135–145)
Total Bilirubin: 0.7 mg/dL (ref 0.3–1.2)
Total Protein: 6.7 g/dL (ref 6.0–8.3)

## 2010-12-31 ENCOUNTER — Encounter (HOSPITAL_BASED_OUTPATIENT_CLINIC_OR_DEPARTMENT_OTHER): Payer: Medicare Other | Admitting: Oncology

## 2010-12-31 ENCOUNTER — Other Ambulatory Visit: Payer: Self-pay | Admitting: Oncology

## 2010-12-31 DIAGNOSIS — E876 Hypokalemia: Secondary | ICD-10-CM

## 2010-12-31 DIAGNOSIS — E785 Hyperlipidemia, unspecified: Secondary | ICD-10-CM

## 2010-12-31 DIAGNOSIS — M949 Disorder of cartilage, unspecified: Secondary | ICD-10-CM

## 2010-12-31 LAB — CBC WITH DIFFERENTIAL/PLATELET
Basophils Absolute: 0 10*3/uL (ref 0.0–0.1)
Eosinophils Absolute: 0.4 10*3/uL (ref 0.0–0.5)
HGB: 11.9 g/dL (ref 11.6–15.9)
MONO#: 0.8 10*3/uL (ref 0.1–0.9)
NEUT#: 1.6 10*3/uL (ref 1.5–6.5)
RBC: 3.88 10*6/uL (ref 3.70–5.45)
RDW: 15.5 % — ABNORMAL HIGH (ref 11.2–14.5)
WBC: 4 10*3/uL (ref 3.9–10.3)
lymph#: 1.1 10*3/uL (ref 0.9–3.3)
nRBC: 0 % (ref 0–0)

## 2011-01-02 LAB — PROTEIN ELECTROPHORESIS, SERUM
Beta Globulin: 6.6 % (ref 4.7–7.2)
Total Protein, Serum Electrophoresis: 7.2 g/dL (ref 6.0–8.3)

## 2011-01-02 LAB — COMPREHENSIVE METABOLIC PANEL
ALT: 12 U/L (ref 0–35)
Albumin: 3.7 g/dL (ref 3.5–5.2)
CO2: 28 mEq/L (ref 19–32)
Chloride: 95 mEq/L — ABNORMAL LOW (ref 96–112)
Potassium: 3 mEq/L — ABNORMAL LOW (ref 3.5–5.3)
Sodium: 136 mEq/L (ref 135–145)
Total Bilirubin: 0.7 mg/dL (ref 0.3–1.2)
Total Protein: 7.2 g/dL (ref 6.0–8.3)

## 2011-01-28 ENCOUNTER — Encounter (HOSPITAL_BASED_OUTPATIENT_CLINIC_OR_DEPARTMENT_OTHER): Payer: Medicare Other | Admitting: Oncology

## 2011-01-28 ENCOUNTER — Other Ambulatory Visit: Payer: Self-pay | Admitting: Oncology

## 2011-01-28 DIAGNOSIS — E876 Hypokalemia: Secondary | ICD-10-CM

## 2011-01-28 DIAGNOSIS — C9000 Multiple myeloma not having achieved remission: Secondary | ICD-10-CM

## 2011-01-28 DIAGNOSIS — M899 Disorder of bone, unspecified: Secondary | ICD-10-CM

## 2011-01-28 LAB — CBC WITH DIFFERENTIAL/PLATELET
EOS%: 5.1 % (ref 0.0–7.0)
Eosinophils Absolute: 0.3 10*3/uL (ref 0.0–0.5)
MCH: 31.9 pg (ref 25.1–34.0)
MCV: 93.9 fL (ref 79.5–101.0)
MONO%: 19.3 % — ABNORMAL HIGH (ref 0.0–14.0)
NEUT#: 2.2 10*3/uL (ref 1.5–6.5)
RBC: 3.81 10*6/uL (ref 3.70–5.45)
RDW: 16.5 % — ABNORMAL HIGH (ref 11.2–14.5)

## 2011-01-30 LAB — PROTEIN ELECTROPHORESIS, SERUM
Alpha-2-Globulin: 13.3 % — ABNORMAL HIGH (ref 7.1–11.8)
Beta Globulin: 7.3 % — ABNORMAL HIGH (ref 4.7–7.2)

## 2011-01-30 LAB — COMPREHENSIVE METABOLIC PANEL
AST: 20 U/L (ref 0–37)
Albumin: 3.7 g/dL (ref 3.5–5.2)
Alkaline Phosphatase: 56 U/L (ref 39–117)
Potassium: 3.4 mEq/L — ABNORMAL LOW (ref 3.5–5.3)
Sodium: 137 mEq/L (ref 135–145)
Total Protein: 7.3 g/dL (ref 6.0–8.3)

## 2011-02-04 ENCOUNTER — Encounter (HOSPITAL_BASED_OUTPATIENT_CLINIC_OR_DEPARTMENT_OTHER): Payer: Medicare Other | Admitting: Oncology

## 2011-05-08 ENCOUNTER — Encounter: Payer: Medicare Other | Admitting: Oncology

## 2011-05-08 ENCOUNTER — Other Ambulatory Visit: Payer: Self-pay | Admitting: Medical

## 2011-05-08 DIAGNOSIS — E876 Hypokalemia: Secondary | ICD-10-CM

## 2011-05-08 DIAGNOSIS — M899 Disorder of bone, unspecified: Secondary | ICD-10-CM

## 2011-05-08 DIAGNOSIS — C9000 Multiple myeloma not having achieved remission: Secondary | ICD-10-CM

## 2011-05-08 LAB — CBC WITH DIFFERENTIAL/PLATELET
Basophils Absolute: 0 10*3/uL (ref 0.0–0.1)
EOS%: 3.3 % (ref 0.0–7.0)
HCT: 36.6 % (ref 34.8–46.6)
HGB: 12.4 g/dL (ref 11.6–15.9)
MCH: 32.3 pg (ref 25.1–34.0)
MCV: 95.3 fL (ref 79.5–101.0)
MONO%: 6.9 % (ref 0.0–14.0)
NEUT%: 57.4 % (ref 38.4–76.8)

## 2011-05-10 LAB — PROTEIN ELECTROPHORESIS, SERUM
Alpha-2-Globulin: 13.1 % — ABNORMAL HIGH (ref 7.1–11.8)
Beta Globulin: 7.2 % (ref 4.7–7.2)
Total Protein, Serum Electrophoresis: 7.5 g/dL (ref 6.0–8.3)

## 2011-05-10 LAB — COMPREHENSIVE METABOLIC PANEL
ALT: 13 U/L (ref 0–35)
Alkaline Phosphatase: 55 U/L (ref 39–117)
CO2: 21 mEq/L (ref 19–32)
Creatinine, Ser: 1.07 mg/dL (ref 0.50–1.10)
Total Bilirubin: 0.6 mg/dL (ref 0.3–1.2)

## 2011-05-13 ENCOUNTER — Ambulatory Visit (HOSPITAL_BASED_OUTPATIENT_CLINIC_OR_DEPARTMENT_OTHER): Payer: Medicare Other | Admitting: Oncology

## 2011-05-13 DIAGNOSIS — C9001 Multiple myeloma in remission: Secondary | ICD-10-CM

## 2011-05-13 DIAGNOSIS — C9000 Multiple myeloma not having achieved remission: Secondary | ICD-10-CM

## 2011-05-13 DIAGNOSIS — M949 Disorder of cartilage, unspecified: Secondary | ICD-10-CM

## 2011-05-13 DIAGNOSIS — Z23 Encounter for immunization: Secondary | ICD-10-CM

## 2011-05-13 DIAGNOSIS — E876 Hypokalemia: Secondary | ICD-10-CM

## 2011-05-27 ENCOUNTER — Encounter: Payer: Self-pay | Admitting: *Deleted

## 2011-05-27 NOTE — Progress Notes (Signed)
Rec'd fax from [facility] that they shipped pt's Rx (revlimid) for delivery on 05/28/11.

## 2011-06-07 ENCOUNTER — Other Ambulatory Visit: Payer: Self-pay | Admitting: *Deleted

## 2011-06-07 ENCOUNTER — Encounter: Payer: Self-pay | Admitting: *Deleted

## 2011-06-07 MED ORDER — POTASSIUM CHLORIDE CRYS ER 20 MEQ PO TBCR: EXTENDED_RELEASE_TABLET | ORAL | Status: DC

## 2011-06-13 ENCOUNTER — Other Ambulatory Visit: Payer: Self-pay | Admitting: Oncology

## 2011-06-13 ENCOUNTER — Other Ambulatory Visit (HOSPITAL_BASED_OUTPATIENT_CLINIC_OR_DEPARTMENT_OTHER): Payer: Medicare Other | Admitting: Lab

## 2011-06-13 DIAGNOSIS — C9000 Multiple myeloma not having achieved remission: Secondary | ICD-10-CM

## 2011-06-13 DIAGNOSIS — M810 Age-related osteoporosis without current pathological fracture: Secondary | ICD-10-CM

## 2011-06-13 DIAGNOSIS — C9001 Multiple myeloma in remission: Secondary | ICD-10-CM

## 2011-06-13 LAB — CBC WITH DIFFERENTIAL/PLATELET
BASO%: 0.3 % (ref 0.0–2.0)
Basophils Absolute: 0 10*3/uL (ref 0.0–0.1)
Eosinophils Absolute: 0.2 10*3/uL (ref 0.0–0.5)
HCT: 36.3 % (ref 34.8–46.6)
HGB: 12.3 g/dL (ref 11.6–15.9)
LYMPH%: 32 % (ref 14.0–49.7)
MCHC: 33.9 g/dL (ref 31.5–36.0)
MONO#: 1.1 10*3/uL — ABNORMAL HIGH (ref 0.1–0.9)
NEUT%: 46.6 % (ref 38.4–76.8)
Platelets: 166 10*3/uL (ref 145–400)
WBC: 6.1 10*3/uL (ref 3.9–10.3)

## 2011-06-18 ENCOUNTER — Other Ambulatory Visit: Payer: Self-pay | Admitting: *Deleted

## 2011-06-18 MED ORDER — LENALIDOMIDE 15 MG PO CAPS: 15.0000 mg | ORAL_CAPSULE | Freq: Every day | ORAL | Status: DC

## 2011-06-18 NOTE — Telephone Encounter (Signed)
[employer] Survey for Revlimid refill completed.  [id number]

## 2011-06-20 ENCOUNTER — Encounter: Payer: Self-pay | Admitting: *Deleted

## 2011-06-20 NOTE — Progress Notes (Signed)
Rec'd fax from [facility] that Revlimid shipped for delivery to pt on 06/21/11

## 2011-07-04 ENCOUNTER — Other Ambulatory Visit (HOSPITAL_BASED_OUTPATIENT_CLINIC_OR_DEPARTMENT_OTHER): Payer: Medicare Other | Admitting: Lab

## 2011-07-04 ENCOUNTER — Other Ambulatory Visit: Payer: Self-pay | Admitting: Oncology

## 2011-07-04 DIAGNOSIS — C9001 Multiple myeloma in remission: Secondary | ICD-10-CM

## 2011-07-04 DIAGNOSIS — E876 Hypokalemia: Secondary | ICD-10-CM

## 2011-07-04 LAB — CBC WITH DIFFERENTIAL/PLATELET
BASO%: 0.4 % (ref 0.0–2.0)
Basophils Absolute: 0 10*3/uL (ref 0.0–0.1)
EOS%: 1.8 % (ref 0.0–7.0)
HCT: 39.5 % (ref 34.8–46.6)
LYMPH%: 30.6 % (ref 14.0–49.7)
MCH: 31.9 pg (ref 25.1–34.0)
MCHC: 33.2 g/dL (ref 31.5–36.0)
MCV: 96.2 fL (ref 79.5–101.0)
MONO%: 7.1 % (ref 0.0–14.0)
NEUT%: 60.1 % (ref 38.4–76.8)
Platelets: 199 10*3/uL (ref 145–400)

## 2011-07-04 LAB — BASIC METABOLIC PANEL
BUN: 16 mg/dL (ref 6–23)
Calcium: 9.1 mg/dL (ref 8.4–10.5)
Creatinine, Ser: 0.71 mg/dL (ref 0.50–1.10)

## 2011-07-11 ENCOUNTER — Other Ambulatory Visit: Payer: Self-pay

## 2011-07-11 MED ORDER — LENALIDOMIDE 15 MG PO CAPS: 15.0000 mg | ORAL_CAPSULE | Freq: Every day | ORAL | Status: DC

## 2011-07-11 NOTE — Progress Notes (Unsigned)
Faxed refill request for Revlimid from [facility] [phone]); Authorization # [id number].

## 2011-07-15 ENCOUNTER — Telehealth: Payer: Self-pay | Admitting: *Deleted

## 2011-07-15 NOTE — Telephone Encounter (Signed)
[facility] faxed Rx report stating Patient's benefits have been reviewed and their co-pay amt. = $431.96.  It has been determined this is a high dollar copay and pt. May qualify for copay assistance.  A Patient Care Coordinator from [employer] Dept will call patient to discuss possible options.  This reports ask for 24 to 48 hrs for additional follow up.

## 2011-07-19 ENCOUNTER — Encounter: Payer: Self-pay | Admitting: *Deleted

## 2011-07-19 NOTE — Progress Notes (Signed)
Fax received from [facility] that pt's rx (revlimid) will be shipped to pt for delivery on 07/22/11.

## 2011-07-24 ENCOUNTER — Other Ambulatory Visit: Payer: Self-pay | Admitting: *Deleted

## 2011-07-24 DIAGNOSIS — D472 Monoclonal gammopathy: Secondary | ICD-10-CM

## 2011-07-24 DIAGNOSIS — M899 Disorder of bone, unspecified: Secondary | ICD-10-CM

## 2011-07-24 DIAGNOSIS — M949 Disorder of cartilage, unspecified: Secondary | ICD-10-CM

## 2011-07-24 DIAGNOSIS — C8171 Other classical Hodgkin lymphoma, lymph nodes of head, face, and neck: Secondary | ICD-10-CM

## 2011-07-24 MED ORDER — ALENDRONATE SODIUM 35 MG PO TABS: 35.0000 mg | ORAL_TABLET | ORAL | Status: DC

## 2011-08-08 ENCOUNTER — Other Ambulatory Visit (HOSPITAL_BASED_OUTPATIENT_CLINIC_OR_DEPARTMENT_OTHER): Payer: Medicare Other | Admitting: Lab

## 2011-08-08 DIAGNOSIS — C9001 Multiple myeloma in remission: Secondary | ICD-10-CM

## 2011-08-08 LAB — CBC WITH DIFFERENTIAL/PLATELET
Basophils Absolute: 0 10*3/uL (ref 0.0–0.1)
EOS%: 3.5 % (ref 0.0–7.0)
Eosinophils Absolute: 0.2 10*3/uL (ref 0.0–0.5)
LYMPH%: 28.6 % (ref 14.0–49.7)
MCH: 32.2 pg (ref 25.1–34.0)
MCV: 95.2 fL (ref 79.5–101.0)
MONO%: 19 % — ABNORMAL HIGH (ref 0.0–14.0)
NEUT#: 3.1 10*3/uL (ref 1.5–6.5)
Platelets: 136 10*3/uL — ABNORMAL LOW (ref 145–400)
RBC: 3.81 10*6/uL (ref 3.70–5.45)
RDW: 15.8 % — ABNORMAL HIGH (ref 11.2–14.5)

## 2011-08-15 NOTE — Progress Notes (Signed)
Faxed refill request for Revlimid to [facility] [phone]), per Dr. Gaylyn Rong;  Auth # [id number].

## 2011-08-29 ENCOUNTER — Other Ambulatory Visit (HOSPITAL_BASED_OUTPATIENT_CLINIC_OR_DEPARTMENT_OTHER): Payer: Medicare Other | Admitting: Lab

## 2011-08-29 DIAGNOSIS — C9001 Multiple myeloma in remission: Secondary | ICD-10-CM

## 2011-08-29 LAB — CBC WITH DIFFERENTIAL/PLATELET
BASO%: 0.4 % (ref 0.0–2.0)
LYMPH%: 29.4 % (ref 14.0–49.7)
MCHC: 33.7 g/dL (ref 31.5–36.0)
MCV: 93.9 fL (ref 79.5–101.0)
MONO%: 6.1 % (ref 0.0–14.0)
Platelets: 193 10*3/uL (ref 145–400)
RBC: 4.09 10*6/uL (ref 3.70–5.45)
RDW: 16.1 % — ABNORMAL HIGH (ref 11.2–14.5)
WBC: 5.1 10*3/uL (ref 3.9–10.3)

## 2011-09-02 LAB — COMPREHENSIVE METABOLIC PANEL
ALT: 17 U/L (ref 0–35)
AST: 27 U/L (ref 0–37)
Alkaline Phosphatase: 55 U/L (ref 39–117)
Potassium: 3.6 mEq/L (ref 3.5–5.3)
Sodium: 137 mEq/L (ref 135–145)
Total Bilirubin: 0.5 mg/dL (ref 0.3–1.2)
Total Protein: 7.2 g/dL (ref 6.0–8.3)

## 2011-09-02 LAB — PROTEIN ELECTROPHORESIS, SERUM
Albumin ELP: 50.4 % — ABNORMAL LOW (ref 55.8–66.1)
Alpha-1-Globulin: 4.4 % (ref 2.9–4.9)
Alpha-2-Globulin: 12.1 % — ABNORMAL HIGH (ref 7.1–11.8)
Beta 2: 7.1 % — ABNORMAL HIGH (ref 3.2–6.5)
Beta Globulin: 7.4 % — ABNORMAL HIGH (ref 4.7–7.2)

## 2011-09-11 ENCOUNTER — Other Ambulatory Visit: Payer: Self-pay | Admitting: *Deleted

## 2011-09-11 MED ORDER — LENALIDOMIDE 15 MG PO CAPS: 15.0000 mg | ORAL_CAPSULE | Freq: Every day | ORAL | Status: DC

## 2011-09-12 ENCOUNTER — Encounter: Payer: Self-pay | Admitting: *Deleted

## 2011-09-12 ENCOUNTER — Encounter: Payer: Self-pay | Admitting: Oncology

## 2011-09-12 ENCOUNTER — Ambulatory Visit (HOSPITAL_BASED_OUTPATIENT_CLINIC_OR_DEPARTMENT_OTHER): Payer: Medicare Other | Admitting: Oncology

## 2011-09-12 ENCOUNTER — Telehealth: Payer: Self-pay | Admitting: Oncology

## 2011-09-12 DIAGNOSIS — I4891 Unspecified atrial fibrillation: Secondary | ICD-10-CM

## 2011-09-12 DIAGNOSIS — C9000 Multiple myeloma not having achieved remission: Secondary | ICD-10-CM

## 2011-09-12 DIAGNOSIS — J4489 Other specified chronic obstructive pulmonary disease: Secondary | ICD-10-CM

## 2011-09-12 DIAGNOSIS — E876 Hypokalemia: Secondary | ICD-10-CM

## 2011-09-12 DIAGNOSIS — F329 Major depressive disorder, single episode, unspecified: Secondary | ICD-10-CM

## 2011-09-12 DIAGNOSIS — F32A Depression, unspecified: Secondary | ICD-10-CM

## 2011-09-12 DIAGNOSIS — M8580 Other specified disorders of bone density and structure, unspecified site: Secondary | ICD-10-CM | POA: Insufficient documentation

## 2011-09-12 DIAGNOSIS — J449 Chronic obstructive pulmonary disease, unspecified: Secondary | ICD-10-CM | POA: Insufficient documentation

## 2011-09-12 DIAGNOSIS — M899 Disorder of bone, unspecified: Secondary | ICD-10-CM

## 2011-09-12 DIAGNOSIS — M949 Disorder of cartilage, unspecified: Secondary | ICD-10-CM

## 2011-09-12 DIAGNOSIS — E785 Hyperlipidemia, unspecified: Secondary | ICD-10-CM

## 2011-09-12 NOTE — Progress Notes (Signed)
[facility] OFFICE PROGRESS NOTE  Cc:  CONROY,NATHAN, PA, PA  DIAGNOSIS:   IgG kappa multiple myeloma with presenting abnormality of elevated total protein without anemia, hypercalcemia, or renal insufficiency.  Her workup showed M-spike of 1.96 g/dL, IgA 3.3 g/dL, kappa free light chain 4.18 mg/dL;  16-XWRU urine collection showed 8.82 mg/dL or  045.4 mg/day free kappa excretion, which was positive for Bence-Jones protein.  Her bone marrow biopsy on 03/17/2009 showed 24% plasma cell.  Cytogenetics were normal.  FISH myeloma showed trisomy 11.  Skeletal survey showed diffuse osteopenia.   PAST THERAPY:  started on Revlimide day 1-21 /Dexamethasone weekly q4 weeks in February 2011 from which she achieved complete response her SPEP/serum free light chains.  CURRENT THERAPY:  started on maintenance Revlimide 15mg  PO daily days 1-21 of every 28 days in June 2011.  INTERVAL HISTORY: Brianna Holden 78 y.o. female returns for regular follow up with her friend.  She reports feeling well except for chronic arthritis in her bilateral hips, knees, ankles.  She needs a cane to ambulate with.  She denies any frequent fall.  She has been having intermittent diarrhea the last few months.  She had diarrhea before starting Revlimid but lately it has been more noticeable.  Within 1-2 hour postprandial, she needs to have BM.  Volume is not too watery; but she does feel crampy at time.  She denies any visible source of bleeding, anorexia, weight loss.  She has mild fatigue; however this has been chronic and she is still independent of activities of daily living.  She is able to take care of her friend who had undergone knee replacement and was bed bound.  Her SOB, DOE have been stable.  She has not had to use her inhalers much.    Patient denies headache, visual changes, confusion, drenching night sweats, palpable lymph node swelling, mucositis, odynophagia, dysphagia, nausea vomiting, jaundice, chest pain,  palpitation, productive cough, gum bleeding, epistaxis, hematemesis, hemoptysis, abdominal swelling, early satiety, melena, hematochezia, hematuria, skin rash, spontaneous bleeding, joint swelling, heat or cold intolerance, bowel bladder incontinence, back pain, focal motor weakness, paresthesia, depression, suicidal or homocidal ideation, feeling hopelessness.   Past Medical History  Diagnosis Date  . Myeloma   . COPD (chronic obstructive pulmonary disease)   . Osteopenia   . Hyperlipidemia   . Hypokalemia   . A-fib   . Depression     No past surgical history on file.  Current Outpatient Prescriptions  Medication Sig Dispense Refill  . alendronate (FOSAMAX) 35 MG tablet Take 1 tablet (35 mg total) by mouth every 7 (seven) days. Take with a full glass of water on an empty stomach.  4 tablet  1  . celecoxib (CELEBREX) 100 MG capsule Take 100 mg by mouth daily. 1 to 2 caps as needed      . digoxin (LANOXIN) 0.25 MG tablet Take 250 mcg by mouth daily.        [name] diltiazem (CARDIZEM CD) 300 MG 24 hr capsule Take 300 mg by mouth daily.        [name] doxazosin (CARDURA) 2 MG tablet Take 2 mg by mouth at bedtime.      [name] FLUoxetine (PROZAC) 40 MG capsule Take 40 mg by mouth daily.        [name] HYDROcodone-acetaminophen (VICODIN) 5-500 MG per tablet Take 1 tablet by mouth every 6 (six) hours as needed.        [name] lenalidomide (REVLIMID) 15 MG capsule Take  1 capsule (15 mg total) by mouth daily. X 21 days on and 7 days off  21 capsule  0  . loperamide (IMODIUM) 2 MG capsule Take 2 mg by mouth 4 (four) times daily as needed.      [name] losartan-hydrochlorothiazide (HYZAAR) 100-25 MG per tablet Take 1 tablet by mouth daily.        . niacin (NIASPAN) 500 MG CR tablet Take 500 mg by mouth at bedtime.        [name] omeprazole (PRILOSEC) 20 MG capsule Take 20 mg by mouth daily.        . potassium chloride SA (K-DUR,KLOR-CON) 20 MEQ tablet Take 3 tablets daily  90 tablet  4  . pravastatin (PRAVACHOL) 40 MG tablet Take 40 mg  by mouth daily.      [name] warfarin (COUMADIN) 4 MG tablet Take 4 mg by mouth daily.        . budesonide (PULMICORT) 180 MCG/ACT inhaler Inhale 1 puff into the lungs 2 (two) times daily as needed.        . ondansetron (ZOFRAN) 8 MG tablet Take by mouth every 12 (twelve) hours as needed.        . prochlorperazine (COMPAZINE) 10 MG tablet Take 10 mg by mouth every 6 (six) hours as needed.          ALLERGIES:   has no known allergies.  REVIEW OF SYSTEMS:  The rest of the 14-point review of system was negative.   Filed Vitals:   09/12/11 1336  BP: 115/76  Pulse: 83  Temp: 97.3 F (36.3 C)   Wt Readings from Last 3 Encounters:  09/12/11 165 lb 3.2 oz (74.934 kg)  05/13/11 166 lb 14.4 oz (75.705 kg)   ECOG Performance status: 1 due to her OA.   PHYSICAL EXAMINATION:   General:  Mildly obese woman in no acute distress.  Eyes:  no scleral icterus.  ENT:  There were no oropharyngeal lesions.  Neck was without thyromegaly.  Lymphatics:  Negative cervical, supraclavicular or axillary adenopathy.  Respiratory: lungs were clear bilaterally without wheezing or crackles.  Cardiovascular:  Regular rate and rhythm, S1/S2, without murmur, rub or gallop.  There was no pedal edema.  GI:  abdomen was soft, flat, nontender, nondistended, without organomegaly.  Muscoloskeletal:  no spinal tenderness of palpation of vertebral spine.  Skin exam was without echymosis, petichae.  Neuro exam was nonfocal.  Patient needed minimal help to get on and off exam table.  Gait was wide spaced with a cane.  Patient was alerted and oriented.  Attention was good.   Language was appropriate.  Mood was normal without depression.  Speech was not pressured.  Thought content was not tangential.       LABORATORY/RADIOLOGY DATA:  Lab Results  Component Value Date   WBC 5.1 08/29/2011   HGB 12.9 08/29/2011   HCT 38.4 08/29/2011   PLT 193 08/29/2011   GLUCOSE 92 08/29/2011   ALT 17 08/29/2011   AST 27 08/29/2011   NA 137 08/29/2011     K 3.6 08/29/2011   CL 100 08/29/2011   CREATININE 0.84 08/29/2011   BUN 13 08/29/2011   CO2 27 08/29/2011   INR 2.00 03/30/2009      ASSESSMENT AND PLAN:   1. History of multiple myeloma:  She has been in remission with Revlimid maintenance.  There is no side effect (except for maybe diarrhea).  I recommended continuing Revlimid without dose modification.  I will check monthly  CBC while on Revlimid.  I brought up the pros and cons of stopping Revlmid vs continuing it past 2 years. She will give me the final decision in June 2013.  2. Age-appropriate cancer screening:  Mammogram with Garald Braver was negative in November 2012.  She last had colonoscopy about 8 years ago in [place], and she has not had one since then.  She does not want to have bowel preps.  3. Osteopenia:  She is on alendronate per PCP. 4. Hypokalemia:  She is on potassium chloride 40 mEq p.o. daily. 5. Hyperlipidemia:  She is on Pravastatin per PCP. 6. History of atrial fib:  She is on digoxin and Coumadin anticoagulation in addition to diltiazem rate control per PCP. 7. Depression:  Controlled with fluoxetine per PCP.  Her mood was pleasant today.  8. COPD:  stable; asymptomatic; on inhalers prn per PCP. 9. Myalgias/arthralgias secondary to osteoarthritis:  She is on over-the-counter pain medication per PCP.    10. Diarrhea:  Possibly due to side effects of Revlimid.  Her symptom is controlled with OTC imodium.  There is also possibility of lactose intolerance.  I advised her to give a trial of refraining from dairy product.  If her problem worsens, I may consider GI referral.  11. Follow up with lab only once a month for CBC to make sure that she is doing okay on chemotherapy, and I will see her myself in about 4 months.  The length of time of the face-to-face encounter was 15 minutes. More than 50% of time was spent counseling and coordination of care.

## 2011-09-12 NOTE — Progress Notes (Signed)
Received fax from [facility] they have received Rx for Revlimid and pt's co pay is $0.  They will contact pt for delivery.

## 2011-09-12 NOTE — Telephone Encounter (Signed)
gv pt appt for [id number] 

## 2011-10-09 NOTE — Progress Notes (Signed)
Patient Brianna Holden survey completed; authorization # [id number]; Rx faxed to [facility] @ [phone].

## 2011-10-10 ENCOUNTER — Other Ambulatory Visit (HOSPITAL_BASED_OUTPATIENT_CLINIC_OR_DEPARTMENT_OTHER): Payer: Medicare Other | Admitting: Lab

## 2011-10-10 DIAGNOSIS — C9000 Multiple myeloma not having achieved remission: Secondary | ICD-10-CM

## 2011-10-10 LAB — CBC WITH DIFFERENTIAL/PLATELET
BASO%: 0.5 % (ref 0.0–2.0)
EOS%: 6.6 % (ref 0.0–7.0)
HCT: 37.2 % (ref 34.8–46.6)
LYMPH%: 45 % (ref 14.0–49.7)
MCH: 30.7 pg (ref 25.1–34.0)
MCHC: 33.6 g/dL (ref 31.5–36.0)
MCV: 91.4 fL (ref 79.5–101.0)
MONO#: 1 10*3/uL — ABNORMAL HIGH (ref 0.1–0.9)
MONO%: 24.4 % — ABNORMAL HIGH (ref 0.0–14.0)
NEUT%: 23.5 % — ABNORMAL LOW (ref 38.4–76.8)
Platelets: 191 10*3/uL (ref 145–400)
RBC: 4.07 10*6/uL (ref 3.70–5.45)
nRBC: 0 % (ref 0–0)

## 2011-11-06 ENCOUNTER — Other Ambulatory Visit: Payer: Self-pay | Admitting: *Deleted

## 2011-11-06 MED ORDER — LENALIDOMIDE 15 MG PO CAPS: 15.0000 mg | ORAL_CAPSULE | Freq: Every day | ORAL | Status: DC

## 2011-11-07 ENCOUNTER — Encounter: Payer: Self-pay | Admitting: *Deleted

## 2011-11-07 NOTE — Progress Notes (Signed)
Rec'd fax from [facility] they have shipped pt's Revlimid for delivery on 11/08/11.

## 2011-11-11 ENCOUNTER — Other Ambulatory Visit (HOSPITAL_BASED_OUTPATIENT_CLINIC_OR_DEPARTMENT_OTHER): Payer: Medicare Other | Admitting: Lab

## 2011-11-11 DIAGNOSIS — C9000 Multiple myeloma not having achieved remission: Secondary | ICD-10-CM

## 2011-11-11 LAB — CBC WITH DIFFERENTIAL/PLATELET
Eosinophils Absolute: 0.2 10*3/uL (ref 0.0–0.5)
HGB: 11.8 g/dL (ref 11.6–15.9)
LYMPH%: 36.1 % (ref 14.0–49.7)
MONO#: 1.1 10*3/uL — ABNORMAL HIGH (ref 0.1–0.9)
NEUT#: 2.3 10*3/uL (ref 1.5–6.5)
Platelets: 165 10*3/uL (ref 145–400)
RBC: 3.71 10*6/uL (ref 3.70–5.45)
RDW: 16.8 % — ABNORMAL HIGH (ref 11.2–14.5)
WBC: 5.6 10*3/uL (ref 3.9–10.3)
nRBC: 0 % (ref 0–0)

## 2011-12-02 ENCOUNTER — Other Ambulatory Visit: Payer: Self-pay | Admitting: *Deleted

## 2011-12-02 MED ORDER — POTASSIUM CHLORIDE CRYS ER 20 MEQ PO TBCR: EXTENDED_RELEASE_TABLET | ORAL | Status: DC

## 2011-12-04 ENCOUNTER — Other Ambulatory Visit: Payer: Self-pay | Admitting: *Deleted

## 2011-12-04 ENCOUNTER — Other Ambulatory Visit: Payer: Self-pay | Admitting: Oncology

## 2011-12-04 MED ORDER — LENALIDOMIDE 15 MG PO CAPS: 15.0000 mg | ORAL_CAPSULE | Freq: Every day | ORAL | Status: DC

## 2011-12-04 NOTE — Telephone Encounter (Signed)
Called pt to do pt survey to call [employer] for Revlimid refill to be valid.  She verbalized understanding.

## 2011-12-05 ENCOUNTER — Encounter: Payer: Self-pay | Admitting: *Deleted

## 2011-12-05 NOTE — Progress Notes (Signed)
Received fax from [facility]. Revlimid will be shipped to pt for delivery on 12/06/11.

## 2011-12-11 ENCOUNTER — Other Ambulatory Visit (HOSPITAL_BASED_OUTPATIENT_CLINIC_OR_DEPARTMENT_OTHER): Payer: Medicare Other | Admitting: Lab

## 2011-12-11 DIAGNOSIS — C9000 Multiple myeloma not having achieved remission: Secondary | ICD-10-CM

## 2011-12-11 LAB — CBC WITH DIFFERENTIAL/PLATELET
Basophils Absolute: 0 10*3/uL (ref 0.0–0.1)
HCT: 32.4 % — ABNORMAL LOW (ref 34.8–46.6)
HGB: 11 g/dL — ABNORMAL LOW (ref 11.6–15.9)
MONO#: 1.3 10*3/uL — ABNORMAL HIGH (ref 0.1–0.9)
NEUT%: 37.5 % — ABNORMAL LOW (ref 38.4–76.8)
WBC: 5.3 10*3/uL (ref 3.9–10.3)
lymph#: 1.8 10*3/uL (ref 0.9–3.3)

## 2011-12-26 ENCOUNTER — Other Ambulatory Visit (HOSPITAL_BASED_OUTPATIENT_CLINIC_OR_DEPARTMENT_OTHER): Payer: Medicare Other | Admitting: Lab

## 2011-12-26 ENCOUNTER — Telehealth: Payer: Self-pay | Admitting: Oncology

## 2011-12-26 ENCOUNTER — Encounter: Payer: Self-pay | Admitting: Oncology

## 2011-12-26 ENCOUNTER — Ambulatory Visit (HOSPITAL_BASED_OUTPATIENT_CLINIC_OR_DEPARTMENT_OTHER): Payer: Medicare Other | Admitting: Oncology

## 2011-12-26 VITALS — BP 115/74 | HR 82 | Temp 97.6°F | Ht 61.3 in | Wt 171.9 lb

## 2011-12-26 DIAGNOSIS — J449 Chronic obstructive pulmonary disease, unspecified: Secondary | ICD-10-CM

## 2011-12-26 DIAGNOSIS — E785 Hyperlipidemia, unspecified: Secondary | ICD-10-CM

## 2011-12-26 DIAGNOSIS — C9000 Multiple myeloma not having achieved remission: Secondary | ICD-10-CM

## 2011-12-26 DIAGNOSIS — M8580 Other specified disorders of bone density and structure, unspecified site: Secondary | ICD-10-CM

## 2011-12-26 DIAGNOSIS — I4891 Unspecified atrial fibrillation: Secondary | ICD-10-CM

## 2011-12-26 DIAGNOSIS — M899 Disorder of bone, unspecified: Secondary | ICD-10-CM

## 2011-12-26 DIAGNOSIS — E876 Hypokalemia: Secondary | ICD-10-CM

## 2011-12-26 DIAGNOSIS — F329 Major depressive disorder, single episode, unspecified: Secondary | ICD-10-CM

## 2011-12-26 DIAGNOSIS — C9001 Multiple myeloma in remission: Secondary | ICD-10-CM

## 2011-12-26 LAB — CBC WITH DIFFERENTIAL/PLATELET
Basophils Absolute: 0 10*3/uL (ref 0.0–0.1)
EOS%: 2.1 % (ref 0.0–7.0)
HCT: 34.2 % — ABNORMAL LOW (ref 34.8–46.6)
HGB: 11.5 g/dL — ABNORMAL LOW (ref 11.6–15.9)
MCH: 31.9 pg (ref 25.1–34.0)
MCV: 95.3 fL (ref 79.5–101.0)
MONO%: 22.6 % — ABNORMAL HIGH (ref 0.0–14.0)
NEUT%: 45.2 % (ref 38.4–76.8)

## 2011-12-26 NOTE — Treatment Plan (Signed)
[facility] END OF TREATMENT   Name: Neftaly Inzunza Gossard Date: 12/26/2011 MRN: [id number] DOB: 12/22/1930   TREATMENT DATES: 12/2009 to 12/2011   REFERRING PHYSICIAN: Lonie Peak, PA   DIAGNOSIS:  multiple myeloma.   STAGE AT START OF TREATMENT:  NA.    INTENT:Maintenance   DRUGS OR REGIMENS GIVEN:  Revlimid.    MAJOR TOXICITIES:  Grade 2 fatigue; grade 2 hypokalemia.    REASON TREATMENT STOPPED:  Finish of planned treatment.   PERFORMANCE STATUS AT END: ECOG 2.   ONGOING PROBLEMS:  Fatigue.    FOLLOW UP PLANS:  Return to Cancer Center in about 4 months.

## 2011-12-26 NOTE — Telephone Encounter (Signed)
gv pt appt schedule for October.  °

## 2011-12-26 NOTE — Patient Instructions (Addendum)
1. Multiple myeloma: Finished 2 years of maintenance Revlimid. The multiple myeloma protein is now undetectable. I therefore recommended to discontinue Revlimid at this time due to potential slight increase in the risk of secondary cancers with longer durations of maintenance therapy with Revlimid. In addition, previous trials that recommend the use of maintenance Revlimid mostly used it for 2 years.  We will continue to monitor myeloma protein levels. In the future if there is disease recurrence, there are newer and very effective options of chemotherapy. 2. Followup: In about 4 months with labs the week prior.

## 2011-12-26 NOTE — Progress Notes (Signed)
Brianna Holden  [phone] [phone]   OFFICE PROGRESS NOTE   Cc:  CONROY,NATHAN, PA  DIAGNOSIS:  IgG kappa multiple myeloma with presenting abnormality of elevated total protein without anemia, hypercalcemia, or renal insufficiency. Her workup showed M-spike of 1.96 g/dL, IgA 3.3 g/dL, kappa free light chain 4.18 mg/dL; 45-WUJW urine collection showed 8.82 mg/dL or 119.1 mg/day free kappa excretion, which was positive for Bence-Jones protein. Her bone marrow biopsy on 03/17/2009 showed 24% plasma cell. Cytogenetics were normal. FISH myeloma showed trisomy 11. Skeletal survey showed diffuse osteopenia.   PAST THERAPY: started on Revlimide day 1-21 /Dexamethasone weekly q4 weeks in February 2011 from which she achieved complete response her SPEP/serum free light chains.   CURRENT THERAPY: started on maintenance Revlimide 15mg  PO daily days 1-21 of every 28 days in June 2011.  INTERVAL HISTORY: Brianna Holden 78 y.o. female returns for regular follow up with her roommate.  She still has mild fatigue.  She is still independent of all activities of daily living; however, her pace is slower than it used to be.  She has diffuse bone ache from OA which predated her diagnosis of myeloma.    Patient denies headache, visual changes, confusion, drenching night sweats, palpable lymph node swelling, mucositis, odynophagia, dysphagia, nausea vomiting, jaundice, chest pain, palpitation, shortness of breath, dyspnea on exertion, productive cough, gum bleeding, epistaxis, hematemesis, hemoptysis, abdominal pain, abdominal swelling, early satiety, melena, hematochezia, hematuria, skin rash, spontaneous bleeding, joint swelling, heat or cold intolerance, bowel bladder incontinence, focal motor weakness, paresthesia, depression.    Past Medical History  Diagnosis Date  . Myeloma   . COPD (chronic obstructive pulmonary disease)   . Osteopenia   . Hyperlipidemia   .  Hypokalemia   . A-fib   . Depression     No past surgical history on file.  Current Outpatient Prescriptions  Medication Sig Dispense Refill  . alendronate (FOSAMAX) 35 MG tablet Take 1 tablet (35 mg total) by mouth every 7 (seven) days. Take with a full glass of water on an empty stomach.  4 tablet  1  . budesonide (PULMICORT) 180 MCG/ACT inhaler Inhale 1 puff into the lungs 2 (two) times daily as needed.        . celecoxib (CELEBREX) 100 MG capsule Take 100 mg by mouth daily. 1 to 2 caps as needed      . digoxin (LANOXIN) 0.25 MG tablet Take 250 mcg by mouth daily.        [name] diltiazem (CARDIZEM CD) 300 MG 24 hr capsule Take 300 mg by mouth daily.        [name] doxazosin (CARDURA) 2 MG tablet Take 2 mg by mouth at bedtime.      [name] FLUoxetine (PROZAC) 40 MG capsule Take 40 mg by mouth daily.        [name] HYDROcodone-acetaminophen (VICODIN) 5-500 MG per tablet Take 1 tablet by mouth every 6 (six) hours as needed.        [name] lenalidomide (REVLIMID) 15 MG capsule Take 1 capsule (15 mg total) by mouth daily. X 21 days on and 7 days off  21 capsule  0  . loperamide (IMODIUM) 2 MG capsule Take 2 mg by mouth 4 (four) times daily as needed.      [name] losartan-hydrochlorothiazide (HYZAAR) 100-25 MG per tablet Take 1 tablet by mouth daily.        [name] omeprazole (PRILOSEC) 20 MG capsule Take 20 mg by mouth daily.        [name]  ondansetron (ZOFRAN) 8 MG tablet Take by mouth every 12 (twelve) hours as needed.        . potassium chloride SA (K-DUR,KLOR-CON) 20 MEQ tablet Take 3 tablets daily  90 tablet  3  . pravastatin (PRAVACHOL) 40 MG tablet Take 40 mg by mouth daily.      . prochlorperazine (COMPAZINE) 10 MG tablet Take 10 mg by mouth every 6 (six) hours as needed.        . warfarin (COUMADIN) 4 MG tablet Take 4 mg by mouth daily.        . niacin (NIASPAN) 500 MG CR tablet Take 500 mg by mouth at bedtime.          ALLERGIES:   has no known allergies.  REVIEW OF SYSTEMS:  The rest of the 14-point review of system was  negative.   Filed Vitals:   12/26/11 1354  BP: 115/74  Pulse: 82  Temp: 97.6 F (36.4 C)   Wt Readings from Last 3 Encounters:  12/26/11 171 lb 14.4 oz (77.973 kg)  09/12/11 165 lb 3.2 oz (74.934 kg)  05/13/11 166 lb 14.4 oz (75.705 kg)   ECOG Performance status: 1-2  PHYSICAL EXAMINATION:   General: Mildly obese woman in no acute distress. Eyes: no scleral icterus. ENT: There were no oropharyngeal lesions. Neck was without thyromegaly. Lymphatics: Negative cervical, supraclavicular or axillary adenopathy. Respiratory: lungs were clear bilaterally without wheezing or crackles. Cardiovascular: Regular rate and rhythm, S1/S2, without murmur, rub or gallop. There was no pedal edema. GI: abdomen was soft, flat, nontender, nondistended, without organomegaly. Muscoloskeletal: no spinal tenderness of palpation of vertebral spine. Skin exam was without echymosis, petichae. Neuro exam was nonfocal. Patient needed assistance to get on and off exam table. Gait was wide spaced with a cane. Patient was alerted and oriented. Attention was good. Language was appropriate. Mood was normal without depression. Speech was not pressured. Thought content was not tangential.    LABORATORY/RADIOLOGY DATA:  Lab Results  Component Value Date   WBC 5.1 12/26/2011   HGB 11.5* 12/26/2011   HCT 34.2* 12/26/2011   PLT 155 12/26/2011   GLUCOSE 99 12/26/2011   ALKPHOS 49 12/26/2011   ALT 14 12/26/2011   AST 18 12/26/2011   NA 138 12/26/2011   K 3.6 12/26/2011   CL 102 12/26/2011   CREATININE 0.66 12/26/2011   BUN 13 12/26/2011   CO2 27 12/26/2011   INR 2.00 03/30/2009     ASSESSMENT AND PLAN:   1. History of multiple myeloma: She has been in remission with Revlimid maintenance.  Her M-spike has been undetected.  She does not have anemia, renal insufficiency or hypercalcemia.  Thus, she has been in remission.   I therefore recommended to discontinue Revlimid at this time due to potential slight increase in the risk of  secondary cancers with longer durations of maintenance therapy with Revlimid. In addition, previous trials that recommend the use of maintenance Revlimid mostly used it for 2 years.  We will continue to monitor myeloma protein levels. In the future if there is disease recurrence, there are newer and very effective options of chemotherapy.  She expressed informed understanding and agreed to stop Revlimid at this time.  2. Osteopenia: She is on alendronate per PCP. 3. Hypokalemia: She is on potassium chloride 40 mEq p.o. Daily.   4. Hyperlipidemia: She is on Pravastatin per PCP. 5. History of atrial fib: She is on digoxin and Coumadin anticoagulation in addition to diltiazem rate control per  PCP. 6. Depression: Controlled with fluoxetine per PCP. Her mood was pleasant today.  7. COPD: stable; asymptomatic; on inhalers prn per PCP. 8. Myalgias/arthralgias secondary to osteoarthritis: She is on over-the-counter pain medication per PCP. 9. Follow up:  In about 4 months.     The length of time of the face-to-face encounter was 25 minutes. More than 50% of time was spent counseling and coordination of care.

## 2011-12-30 LAB — PROTEIN ELECTROPHORESIS, SERUM
Albumin ELP: 54.2 % — ABNORMAL LOW (ref 55.8–66.1)
Alpha-1-Globulin: 5.3 % — ABNORMAL HIGH (ref 2.9–4.9)
Alpha-2-Globulin: 13.7 % — ABNORMAL HIGH (ref 7.1–11.8)
Gamma Globulin: 12.6 % (ref 11.1–18.8)

## 2011-12-30 LAB — KAPPA/LAMBDA LIGHT CHAINS
Kappa free light chain: 3.77 mg/dL — ABNORMAL HIGH (ref 0.33–1.94)
Kappa:Lambda Ratio: 1.55 (ref 0.26–1.65)
Lambda Free Lght Chn: 2.44 mg/dL (ref 0.57–2.63)

## 2011-12-30 LAB — COMPREHENSIVE METABOLIC PANEL
AST: 18 U/L (ref 0–37)
Alkaline Phosphatase: 49 U/L (ref 39–117)
BUN: 13 mg/dL (ref 6–23)
Calcium: 8.5 mg/dL (ref 8.4–10.5)
Chloride: 102 mEq/L (ref 96–112)
Creatinine, Ser: 0.66 mg/dL (ref 0.50–1.10)

## 2012-01-03 ENCOUNTER — Other Ambulatory Visit: Payer: Self-pay | Admitting: *Deleted

## 2012-01-03 NOTE — Telephone Encounter (Signed)
Received refill request for Revlimid and was instructed by Clenton Pare, NP that med has been d/c'd by Dr. Gaylyn Rong.  Refer to last office note.  Called [facility] at [phone] and notified of medication d/c'd.

## 2012-04-14 ENCOUNTER — Other Ambulatory Visit: Payer: Self-pay | Admitting: Oncology

## 2012-04-20 ENCOUNTER — Other Ambulatory Visit (HOSPITAL_BASED_OUTPATIENT_CLINIC_OR_DEPARTMENT_OTHER): Payer: Medicare Other | Admitting: Lab

## 2012-04-20 DIAGNOSIS — C9001 Multiple myeloma in remission: Secondary | ICD-10-CM

## 2012-04-20 DIAGNOSIS — C9000 Multiple myeloma not having achieved remission: Secondary | ICD-10-CM

## 2012-04-20 LAB — CBC WITH DIFFERENTIAL/PLATELET
Basophils Absolute: 0 10*3/uL (ref 0.0–0.1)
EOS%: 0.9 % (ref 0.0–7.0)
HGB: 12.3 g/dL (ref 11.6–15.9)
LYMPH%: 23.5 % (ref 14.0–49.7)
MCH: 31.3 pg (ref 25.1–34.0)
MCV: 94.5 fL (ref 79.5–101.0)
MONO%: 10.3 % (ref 0.0–14.0)
NEUT%: 64.8 % (ref 38.4–76.8)
Platelets: 247 10*3/uL (ref 145–400)
RDW: 13.8 % (ref 11.2–14.5)

## 2012-04-20 LAB — COMPREHENSIVE METABOLIC PANEL (CC13)
CO2: 24 mEq/L (ref 22–29)
Calcium: 9.2 mg/dL (ref 8.4–10.4)
Chloride: 101 mEq/L (ref 98–107)
Creatinine: 0.7 mg/dL (ref 0.6–1.1)
Glucose: 97 mg/dl (ref 70–99)
Total Bilirubin: 0.5 mg/dL (ref 0.20–1.20)

## 2012-04-22 LAB — PROTEIN ELECTROPHORESIS, SERUM
Beta 2: 6.9 % — ABNORMAL HIGH (ref 3.2–6.5)
Beta Globulin: 7.5 % — ABNORMAL HIGH (ref 4.7–7.2)
Gamma Globulin: 15 % (ref 11.1–18.8)

## 2012-04-23 ENCOUNTER — Telehealth: Payer: Self-pay | Admitting: *Deleted

## 2012-04-23 NOTE — Telephone Encounter (Signed)
Pt called to ask if she can r/s her appt for next week to a later date.  Her roommate is having surgery next week and she is her primary caregiver. Dr. Gaylyn Rong states pt can f/u w/i next one to two months at her convenience.  Informed pt of POF sent to scheduling to call her to get appt r/s.  Pt verbalized understanding.

## 2012-04-24 ENCOUNTER — Telehealth: Payer: Self-pay | Admitting: *Deleted

## 2012-04-24 NOTE — Telephone Encounter (Signed)
R/S w/ Belenda Cruise from 10/16 to w/i next one to two months at pt's convenience  Gave patient appointment for 06-24-2012 at 1:15pm  Patient confirmed over the phone the new date and time

## 2012-04-27 ENCOUNTER — Ambulatory Visit: Payer: Medicare Other | Admitting: Oncology

## 2012-04-29 ENCOUNTER — Ambulatory Visit: Payer: Medicare Other | Admitting: Oncology

## 2012-06-08 ENCOUNTER — Telehealth: Payer: Self-pay | Admitting: *Deleted

## 2012-06-08 NOTE — Telephone Encounter (Signed)
Pt called to ask if she needs a lab appt prior to her next visit on 06/24/12.   Per Dr. Gaylyn Rong no need to repeat labs from October,  Keep appt only as scheduled.  She verbalized understanding.

## 2012-06-24 ENCOUNTER — Encounter: Payer: Self-pay | Admitting: Oncology

## 2012-06-24 ENCOUNTER — Ambulatory Visit (HOSPITAL_BASED_OUTPATIENT_CLINIC_OR_DEPARTMENT_OTHER): Payer: Medicare Other | Admitting: Oncology

## 2012-06-24 ENCOUNTER — Telehealth: Payer: Self-pay | Admitting: Oncology

## 2012-06-24 VITALS — BP 131/85 | HR 56 | Temp 96.7°F | Resp 20 | Ht 61.3 in | Wt 168.8 lb

## 2012-06-24 DIAGNOSIS — E876 Hypokalemia: Secondary | ICD-10-CM

## 2012-06-24 DIAGNOSIS — M899 Disorder of bone, unspecified: Secondary | ICD-10-CM

## 2012-06-24 DIAGNOSIS — F329 Major depressive disorder, single episode, unspecified: Secondary | ICD-10-CM

## 2012-06-24 DIAGNOSIS — C9000 Multiple myeloma not having achieved remission: Secondary | ICD-10-CM

## 2012-06-24 NOTE — Progress Notes (Signed)
[facility]  Telephone:([phone] Fax:(336) [phone]   OFFICE PROGRESS NOTE   Cc:  CONROY,NATHAN, PA  DIAGNOSIS:  IgG kappa multiple myeloma with presenting abnormality of elevated total protein without anemia, hypercalcemia, or renal insufficiency. Her workup showed M-spike of 1.96 g/dL, IgA 3.3 g/dL, kappa free light chain 4.18 mg/dL; 19-JYNW urine collection showed 8.82 mg/dL or 295.6 mg/day free kappa excretion, which was positive for Bence-Jones protein. Her bone marrow biopsy on 03/17/2009 showed 24% plasma cell. Cytogenetics were normal. FISH myeloma showed trisomy 11. Skeletal survey showed diffuse osteopenia.   PAST THERAPY: 1. started on Revlimide day 1-21 /Dexamethasone weekly q4 weeks in February 2011 from which she achieved complete response her SPEP/serum free light chains.  2. started on maintenance Revlimide 15mg  PO daily days 1-21 of every 28 days in June 2011 and completed in June 2013.  CURRENT THERAPY: Watchful observation  INTERVAL HISTORY: Brianna Holden 78 y.o. female returns for regular follow up with her roommate.  She still has mild fatigue.  She is still independent of all activities of daily living; however, her pace is slower than it used to be.  She has diffuse bone ache from OA which predated her diagnosis of myeloma.    Patient denies headache, visual changes, confusion, drenching night sweats, palpable lymph node swelling, mucositis, odynophagia, dysphagia, nausea vomiting, jaundice, chest pain, palpitation, shortness of breath, dyspnea on exertion, productive cough, gum bleeding, epistaxis, hematemesis, hemoptysis, abdominal pain, abdominal swelling, early satiety, melena, hematochezia, hematuria, skin rash, spontaneous bleeding, joint swelling, heat or cold intolerance, bowel bladder incontinence, focal motor weakness, paresthesia, depression.    Past Medical History  Diagnosis Date  . Myeloma   . COPD (chronic obstructive pulmonary  disease)   . Osteopenia   . Hyperlipidemia   . Hypokalemia   . A-fib   . Depression     History reviewed. No pertinent past surgical history.  Current Outpatient Prescriptions  Medication Sig Dispense Refill  . budesonide (PULMICORT) 180 MCG/ACT inhaler Inhale 1 puff into the lungs 2 (two) times daily as needed.        . celecoxib (CELEBREX) 100 MG capsule Take 100 mg by mouth daily. 1 to 2 caps as needed      . digoxin (LANOXIN) 0.25 MG tablet Take 250 mcg by mouth daily.        [name] diltiazem (CARDIZEM CD) 300 MG 24 hr capsule Take 300 mg by mouth daily.        [name] doxazosin (CARDURA) 2 MG tablet Take 2 mg by mouth at bedtime.      [name] FLUoxetine (PROZAC) 40 MG capsule Take 40 mg by mouth daily.        [name] HYDROcodone-acetaminophen (VICODIN) 5-500 MG per tablet Take 1 tablet by mouth every 6 (six) hours as needed.        . loperamide (IMODIUM) 2 MG capsule Take 2 mg by mouth 4 (four) times daily as needed.      [name] losartan-hydrochlorothiazide (HYZAAR) 100-25 MG per tablet Take 1 tablet by mouth daily.        . niacin (NIASPAN) 500 MG CR tablet Take 500 mg by mouth at bedtime.        [name] omeprazole (PRILOSEC) 20 MG capsule Take 20 mg by mouth daily.        . potassium chloride SA (K-DUR,KLOR-CON) 20 MEQ tablet TAKE THREE TABLETS BY MOUTH DAILY AS DIRECTED  90 tablet  0  . pravastatin (PRAVACHOL) 40 MG tablet  Take 40 mg by mouth daily.      [name] warfarin (COUMADIN) 4 MG tablet Take 4 mg by mouth daily.          ALLERGIES:   has no known allergies.  REVIEW OF SYSTEMS:  The rest of the 14-point review of system was negative.   Filed Vitals:   06/24/12 1323  BP: 131/85  Pulse: 56  Temp: 96.7 F (35.9 C)  Resp: 20   Wt Readings from Last 3 Encounters:  06/24/12 168 lb 12.8 oz (76.567 kg)  12/26/11 171 lb 14.4 oz (77.973 kg)  09/12/11 165 lb 3.2 oz (74.934 kg)   ECOG Performance status: 1-2  PHYSICAL EXAMINATION:   General: Mildly obese woman in no acute distress. Eyes: no scleral  icterus. ENT: There were no oropharyngeal lesions. Neck was without thyromegaly. Lymphatics: Negative cervical, supraclavicular or axillary adenopathy. Respiratory: lungs were clear bilaterally without wheezing or crackles. Cardiovascular: Regular rate and rhythm, S1/S2, without murmur, rub or gallop. There was no pedal edema. GI: abdomen was soft, flat, nontender, nondistended, without organomegaly. Muscoloskeletal: no spinal tenderness of palpation of vertebral spine. Skin exam was without echymosis, petichae. Neuro exam was nonfocal. Patient needed assistance to get on and off exam table. Gait was wide spaced with a cane. Patient was alerted and oriented. Attention was good. Language was appropriate. Mood was normal without depression. Speech was not pressured. Thought content was not tangential.    LABORATORY/RADIOLOGY DATA:  Lab Results  Component Value Date   WBC 8.8 04/20/2012   HGB 12.3 04/20/2012   HCT 37.3 04/20/2012   PLT 247 04/20/2012   GLUCOSE 97 04/20/2012   ALKPHOS 55 04/20/2012   ALT 17 04/20/2012   AST 22 04/20/2012   NA 135* 04/20/2012   K 4.0 04/20/2012   CL 101 04/20/2012   CREATININE 0.7 04/20/2012   BUN 16.0 04/20/2012   CO2 24 04/20/2012   INR 2.00 03/30/2009     ASSESSMENT AND PLAN:   1. History of multiple myeloma: She has been in remission with Revlimid maintenance.  Her M-spike has been undetected.  She does not have anemia, renal insufficiency or hypercalcemia.  Thus, she has been in remission.   Recommend continued observation. 2. Osteopenia: Patient stopped alendronate due to concerns for side effects. I have recommended that she take Calcium + D BID. 3. Hypokalemia: She is on potassium chloride 40 mEq p.o. Daily.   4. Hyperlipidemia: She is on Pravastatin per PCP. 5. History of atrial fib: She is on digoxin and Coumadin anticoagulation in addition to diltiazem rate control per PCP. 6. Depression: Controlled with fluoxetine per PCP. Her mood was pleasant today.   7. COPD: stable; asymptomatic; on inhalers prn per PCP. 8. Myalgias/arthralgias secondary to osteoarthritis: She is on over-the-counter pain medication per PCP. 9. Follow up:  In about 4 months.     The length of time of the face-to-face encounter was 15 minutes. More than 50% of time was spent counseling and coordination of care.

## 2012-06-24 NOTE — Telephone Encounter (Signed)
Gave pt appt for April 2014 lab and MD °

## 2012-09-03 ENCOUNTER — Telehealth: Payer: Self-pay | Admitting: Oncology

## 2012-09-03 NOTE — Telephone Encounter (Signed)
s.w. pt and advised on est appt change...pt ok and aware

## 2012-10-16 ENCOUNTER — Other Ambulatory Visit (HOSPITAL_BASED_OUTPATIENT_CLINIC_OR_DEPARTMENT_OTHER): Payer: Medicare Other

## 2012-10-16 DIAGNOSIS — C9000 Multiple myeloma not having achieved remission: Secondary | ICD-10-CM

## 2012-10-16 LAB — CBC WITH DIFFERENTIAL/PLATELET
Basophils Absolute: 0 10*3/uL (ref 0.0–0.1)
EOS%: 1 % (ref 0.0–7.0)
HGB: 12.5 g/dL (ref 11.6–15.9)
MCH: 30.1 pg (ref 25.1–34.0)
MONO#: 1.1 10*3/uL — ABNORMAL HIGH (ref 0.1–0.9)
NEUT#: 7 10*3/uL — ABNORMAL HIGH (ref 1.5–6.5)
RDW: 14.6 % — ABNORMAL HIGH (ref 11.2–14.5)
WBC: 10 10*3/uL (ref 3.9–10.3)
lymph#: 1.7 10*3/uL (ref 0.9–3.3)

## 2012-10-16 LAB — COMPREHENSIVE METABOLIC PANEL (CC13)
ALT: 16 U/L (ref 0–55)
AST: 21 U/L (ref 5–34)
Albumin: 3.2 g/dL — ABNORMAL LOW (ref 3.5–5.0)
BUN: 13.2 mg/dL (ref 7.0–26.0)
Calcium: 9 mg/dL (ref 8.4–10.4)
Chloride: 102 mEq/L (ref 98–107)
Potassium: 4.4 mEq/L (ref 3.5–5.1)

## 2012-10-20 ENCOUNTER — Telehealth: Payer: Self-pay | Admitting: Oncology

## 2012-10-20 LAB — PROTEIN ELECTROPHORESIS, SERUM
Alpha-2-Globulin: 15.1 % — ABNORMAL HIGH (ref 7.1–11.8)
Beta Globulin: 7.6 % — ABNORMAL HIGH (ref 4.7–7.2)
Gamma Globulin: 12.2 % (ref 11.1–18.8)
M-Spike, %: 0.23 g/dL

## 2012-10-20 LAB — KAPPA/LAMBDA LIGHT CHAINS: Kappa:Lambda Ratio: 1.46 (ref 0.26–1.65)

## 2012-10-20 NOTE — Telephone Encounter (Signed)
lvm for pt regarding to moving appt to 4.23.14...Marland Kitchenpt had two prior appt on the 4.24.14

## 2012-10-23 ENCOUNTER — Ambulatory Visit: Payer: Medicare Other | Admitting: Oncology

## 2012-11-02 NOTE — Patient Instructions (Addendum)
1.  History of myeloma. 2.  Status:  Last myeloma panel on 10/16/2012 showed slight positive M-spike. 3.  Options:  *  Conservative:  Wait for 2 months and repeat Mspike light chain.  If increases, resume chemo then.  *  Compromise:  Resume Revlimid.  *  Aggressive:  Resume chemo with new agent pamolidomide (another chemo pill related to Revlimid).

## 2012-11-04 ENCOUNTER — Ambulatory Visit (HOSPITAL_BASED_OUTPATIENT_CLINIC_OR_DEPARTMENT_OTHER): Payer: Medicare Other | Admitting: Oncology

## 2012-11-04 ENCOUNTER — Telehealth: Payer: Self-pay | Admitting: Oncology

## 2012-11-04 VITALS — BP 114/75 | HR 89 | Temp 96.5°F | Resp 18 | Ht 61.3 in | Wt 175.0 lb

## 2012-11-04 DIAGNOSIS — C9000 Multiple myeloma not having achieved remission: Secondary | ICD-10-CM

## 2012-11-04 NOTE — Telephone Encounter (Signed)
gv and printed appt sched and avs for pt  °

## 2012-11-04 NOTE — Progress Notes (Signed)
[facility]  [phone] Fax:(336) [phone]   OFFICE PROGRESS NOTE   Cc:  CONROY,NATHAN, PA-C  DIAGNOSIS:  IgG kappa multiple myeloma with presenting abnormality of elevated total protein without anemia, hypercalcemia, or renal insufficiency. Her workup showed M-spike of 1.96 g/dL, IgA 3.3 g/dL, kappa free light chain 4.18 mg/dL; 45-WUJW urine collection showed 8.82 mg/dL or 119.1 mg/day free kappa excretion, which was positive for Bence-Jones protein. Her bone marrow biopsy on 03/17/2009 showed 24% plasma cell. Cytogenetics were normal. FISH myeloma showed trisomy 11. Skeletal survey showed diffuse osteopenia.   PAST THERAPY: started on Revlimide day 1-21 /Dexamethasone weekly q4 weeks in February 2011 from which she achieved complete response her SPEP/serum free light chains.  She was on maintenance Revlimide 15mg  PO daily days 1-21 of every 28 days between June 2011 and June 2013 when it was discontinued after 2 years.   CURRENT THERAPY: watchful observation.   INTERVAL HISTORY: Brianna Holden 79 y.o. female returns for regular follow up with her roommate.  She has moderate to severe bilateral knee pain (right worse than left).  She is seeing Ortho for possible knee replacement in the near future.  She cannot ambulate much or do any gardening or active chores due to severe knee pain.  She denied fever, open wound, erythema of the knees.  She has mild and stable SOB and DOE which improve with inhalers.  She denied recurrent infection, abd pain, chest pain, bleeding symptoms, lower back pain, lower extremity weakness, bowel or bladder incontinence.  The rest of the 14 point review of system was negative.    Past Medical History  Diagnosis Date  . Myeloma   . COPD (chronic obstructive pulmonary disease)   . Osteopenia   . Hyperlipidemia   . Hypokalemia   . A-fib   . Depression     No past surgical history on file.  Current Outpatient Prescriptions    Medication Sig Dispense Refill  . budesonide (PULMICORT) 180 MCG/ACT inhaler Inhale 1 puff into the lungs 2 (two) times daily as needed.        . celecoxib (CELEBREX) 100 MG capsule Take 100 mg by mouth daily. 1 to 2 caps as needed      . digoxin (LANOXIN) 0.25 MG tablet Take 250 mcg by mouth daily.        [name] diltiazem (CARDIZEM CD) 300 MG 24 hr capsule Take 300 mg by mouth daily.        [name] doxazosin (CARDURA) 2 MG tablet Take 2 mg by mouth at bedtime.      [name] FLUoxetine (PROZAC) 40 MG capsule Take 40 mg by mouth daily.        [name] HYDROcodone-acetaminophen (NORCO/VICODIN) 5-325 MG per tablet Take 1 tablet by mouth every 6 (six) hours as needed for pain.      [name] loperamide (IMODIUM) 2 MG capsule Take 2 mg by mouth 4 (four) times daily as needed.      [name] losartan-hydrochlorothiazide (HYZAAR) 100-25 MG per tablet Take 1 tablet by mouth daily.        . niacin (NIASPAN) 500 MG CR tablet Take 500 mg by mouth at bedtime.        [name] omeprazole (PRILOSEC) 20 MG capsule Take 20 mg by mouth daily.        . potassium chloride SA (K-DUR,KLOR-CON) 20 MEQ tablet TAKE THREE TABLETS BY MOUTH DAILY AS DIRECTED  90 tablet  0  . pravastatin (PRAVACHOL) 40 MG tablet  Take 40 mg by mouth daily.      [name] warfarin (COUMADIN) 4 MG tablet Take 4 mg by mouth daily.         No current facility-administered medications for this visit.    ALLERGIES:  has No Known Allergies.  REVIEW OF SYSTEMS:  The rest of the 14-point review of system was negative.   Filed Vitals:   11/04/12 1350  BP: 114/75  Pulse: 89  Temp: 96.5 F (35.8 C)  Resp: 18   Wt Readings from Last 3 Encounters:  11/04/12 175 lb (79.379 kg)  06/24/12 168 lb 12.8 oz (76.567 kg)  12/26/11 171 lb 14.4 oz (77.973 kg)   ECOG Performance status: 2  PHYSICAL EXAMINATION:   General: Mildly obese woman in no acute distress. Eyes: no scleral icterus. ENT: There were no oropharyngeal lesions. Neck was without thyromegaly. Lymphatics: Negative cervical,  supraclavicular or axillary adenopathy. Respiratory: lungs were clear bilaterally without wheezing or crackles. Cardiovascular: Regular rate and rhythm, S1/S2, without murmur, rub or gallop. There was no pedal edema. GI: abdomen was soft, flat, nontender, nondistended, without organomegaly. Muscoloskeletal: no spinal tenderness of palpation of vertebral spine. There was no tenderness or increased in temperature on palpation or right knee.Skin exam was without echymosis, petichae. Neuro exam was nonfocal. Patient needed assistance to get on and off exam table. Gait was wide spaced with a cane. Patient was alerted and oriented. Attention was good. Language was appropriate. Mood was normal without depression. Speech was not pressured. Thought content was not tangential.    LABORATORY/RADIOLOGY DATA:  Lab Results  Component Value Date   WBC 10.0 10/16/2012   HGB 12.5 10/16/2012   HCT 37.5 10/16/2012   PLT 247 10/16/2012   GLUCOSE 107* 10/16/2012   ALKPHOS 63 10/16/2012   ALT 16 10/16/2012   AST 21 10/16/2012   NA 137 10/16/2012   K 4.4 10/16/2012   CL 102 10/16/2012   CREATININE 0.7 10/16/2012   BUN 13.2 10/16/2012   CO2 26 10/16/2012   INR 2.00 03/30/2009     ASSESSMENT AND PLAN:   1. History of multiple myeloma: She has been in remission with Revlimid maintenance until it was stopped in 12/2011 after two years.    Last myeloma panel on 10/16/2012 showed slight positive M-spike. Options at this time.   *  Conservative:  Wait for 3 months and repeat Mspike light chain.  If increases, resume chemo then.  *  Compromise:  Resume Revlimid.  *  Aggressive:  Resume chemo with new agent pamolidomide (another chemo pill related to Revlimid).   She would like to wait to repeat M-spike in about 3 months since she does not have sign of active disease at this time.  There is no anemia, renal insufficiency, or hypercalcemia.  Before resuming chemo for increasing M-spike, she will need a repeat staging bone marrow biopsy.     2. Hypokalemia: She is on potassium chloride 40 mEq p.o. Daily.   3. Hyperlipidemia: She is on Pravastatin per PCP. 4. History of atrial fib: She is on digoxin and Coumadin anticoagulation in addition to diltiazem rate control per PCP. 5. Depression: Controlled with fluoxetine per PCP. Her mood was pleasant today.  6. COPD: stable; asymptomatic; on inhalers prn per PCP. 7. DJD:  Right knee, pending eval by ortho for replacement.  The possibility of recurrent myeloma should not prevent her from having right knee replacement since the prognosis of myeloma can be years.  8. Follow up:  In  about 3 months.   I informed Ms. Baller that I am leaving the practice. [facility] will arrange for her follow up with a new oncologist.   The length of time of the face-to-face encounter was 25 minutes. More than 50% of time was spent counseling and coordination of care.     Kellar Westberg T. Gaylyn Rong, M.D.

## 2012-11-05 ENCOUNTER — Ambulatory Visit: Payer: Medicare Other | Admitting: Oncology

## 2012-12-14 ENCOUNTER — Telehealth: Payer: Self-pay | Admitting: *Deleted

## 2012-12-14 NOTE — Telephone Encounter (Signed)
Pt called, states wants to transfer her care to Dr. Clelia Croft after Dr. Emeterio Reeve the practice.  She does not want to be assigned to another physician.   Informed pt I will notify Dr. Gaylyn Rong of her request.

## 2012-12-14 NOTE — Telephone Encounter (Signed)
Hi Firas and Melissa,  This patient has been seeing me for myeloma.  Since I'm leaving, she requested switching to you, Milinda Cave.  I'll ask Melissa to arrange for this.  Thanks.  Bryah O[place]ltree.

## 2012-12-14 NOTE — Telephone Encounter (Signed)
I'll send a message to Dory Peru and Dr. Clelia Croft.  Thanks.

## 2013-02-03 ENCOUNTER — Other Ambulatory Visit (HOSPITAL_BASED_OUTPATIENT_CLINIC_OR_DEPARTMENT_OTHER): Payer: Medicare Other | Admitting: Lab

## 2013-02-03 DIAGNOSIS — C9001 Multiple myeloma in remission: Secondary | ICD-10-CM

## 2013-02-03 DIAGNOSIS — C9000 Multiple myeloma not having achieved remission: Secondary | ICD-10-CM

## 2013-02-03 LAB — CBC WITH DIFFERENTIAL/PLATELET
Basophils Absolute: 0 10*3/uL (ref 0.0–0.1)
EOS%: 0.7 % (ref 0.0–7.0)
HCT: 36.8 % (ref 34.8–46.6)
HGB: 12.3 g/dL (ref 11.6–15.9)
MONO#: 1 10*3/uL — ABNORMAL HIGH (ref 0.1–0.9)
NEUT#: 5.6 10*3/uL (ref 1.5–6.5)
NEUT%: 65.1 % (ref 38.4–76.8)
RDW: 14.8 % — ABNORMAL HIGH (ref 11.2–14.5)
WBC: 8.6 10*3/uL (ref 3.9–10.3)
lymph#: 1.9 10*3/uL (ref 0.9–3.3)

## 2013-02-03 LAB — COMPREHENSIVE METABOLIC PANEL (CC13)
Alkaline Phosphatase: 62 U/L (ref 40–150)
CO2: 28 mEq/L (ref 22–29)
Creatinine: 0.7 mg/dL (ref 0.6–1.1)
Glucose: 88 mg/dl (ref 70–140)
Sodium: 137 mEq/L (ref 136–145)
Total Bilirubin: 0.63 mg/dL (ref 0.20–1.20)

## 2013-02-05 LAB — SPEP & IFE WITH QIG
Albumin ELP: 52.9 % — ABNORMAL LOW (ref 55.8–66.1)
Alpha-1-Globulin: 4.5 % (ref 2.9–4.9)
Beta 2: 5.3 % (ref 3.2–6.5)
Gamma Globulin: 17.9 % (ref 11.1–18.8)
IgA: 318 mg/dL (ref 69–380)
IgM, Serum: 39 mg/dL — ABNORMAL LOW (ref 52–322)

## 2013-02-05 LAB — KAPPA/LAMBDA LIGHT CHAINS
Kappa free light chain: 3.61 mg/dL — ABNORMAL HIGH (ref 0.33–1.94)
Kappa:Lambda Ratio: 2.16 — ABNORMAL HIGH (ref 0.26–1.65)

## 2013-02-10 ENCOUNTER — Ambulatory Visit: Payer: Medicare Other | Admitting: Oncology

## 2013-02-10 ENCOUNTER — Ambulatory Visit (HOSPITAL_BASED_OUTPATIENT_CLINIC_OR_DEPARTMENT_OTHER): Payer: Medicare Other | Admitting: Oncology

## 2013-02-10 ENCOUNTER — Telehealth: Payer: Self-pay | Admitting: Oncology

## 2013-02-10 VITALS — BP 146/81 | HR 75 | Temp 97.4°F | Resp 19 | Ht 61.3 in | Wt 177.1 lb

## 2013-02-10 DIAGNOSIS — C9000 Multiple myeloma not having achieved remission: Secondary | ICD-10-CM

## 2013-02-10 NOTE — Telephone Encounter (Signed)
gv and printed appt sched and avs for pt  °

## 2013-02-10 NOTE — Progress Notes (Signed)
[facility]  [phone] Fax:(336) [phone]   OFFICE PROGRESS NOTE   Cc:  CONROY,NATHAN, PA-C  DIAGNOSIS:  IgG kappa multiple myeloma with presenting abnormality of elevated total protein without anemia, hypercalcemia, or renal insufficiency. Her workup showed M-spike of 1.96 g/dL, IgA 3.3 g/dL, kappa free light chain 4.18 mg/dL; 95-AOZH urine collection showed 8.82 mg/dL or 086.5 mg/day free kappa excretion, which was positive for Bence-Jones protein. Her bone marrow biopsy on 03/17/2009 showed 24% plasma cell. Cytogenetics were normal. FISH myeloma showed trisomy 11. Skeletal survey showed diffuse osteopenia.   PAST THERAPY: started on Revlimide day 1-21 /Dexamethasone weekly q4 weeks in February 2011 from which she achieved complete response her SPEP/serum free light chains.  She was on maintenance Revlimide 15mg  PO daily days 1-21 of every 28 days between June 2011 and June 2013 when it was discontinued after 2 years.   CURRENT THERAPY: watchful observation.   INTERVAL HISTORY: Brianna Holden 79 y.o. female returns for regular follow up visit. She is a very nice women formaly of Dr. Gaylyn Rong with the above diagnosis. She has moderate to severe bilateral knee pain (right worse than left).  She is seeing Ortho for possible knee replacement in the near future.  She cannot ambulate much or do any gardening or active chores due to severe knee pain.  She denied fever, open wound, erythema of the knees.  She has mild and stable SOB and DOE which improve with inhalers.  She denied recurrent infection, abd pain, chest pain, bleeding symptoms, lower back pain, lower extremity weakness, bowel or bladder incontinence.   No recent illness or hospitalizations.   Past Medical History  Diagnosis Date  . Myeloma   . COPD (chronic obstructive pulmonary disease)   . Osteopenia   . Hyperlipidemia   . Hypokalemia   . A-fib   . Depression     No past surgical history on  file.  Current Outpatient Prescriptions  Medication Sig Dispense Refill  . budesonide (PULMICORT) 180 MCG/ACT inhaler Inhale 1 puff into the lungs 2 (two) times daily as needed.        . celecoxib (CELEBREX) 100 MG capsule Take 100 mg by mouth daily. 1 to 2 caps as needed      . digoxin (LANOXIN) 0.25 MG tablet Take 250 mcg by mouth daily.        [name] diltiazem (CARDIZEM CD) 300 MG 24 hr capsule Take 300 mg by mouth daily.        [name] doxazosin (CARDURA) 2 MG tablet Take 2 mg by mouth at bedtime.      [name] FLUoxetine (PROZAC) 40 MG capsule Take 40 mg by mouth daily.        [name] HYDROcodone-acetaminophen (NORCO/VICODIN) 5-325 MG per tablet Take 1 tablet by mouth every 6 (six) hours as needed for pain.      [name] loperamide (IMODIUM) 2 MG capsule Take 2 mg by mouth 4 (four) times daily as needed.      [name] losartan-hydrochlorothiazide (HYZAAR) 100-25 MG per tablet Take 1 tablet by mouth daily.        . niacin (NIASPAN) 500 MG CR tablet Take 500 mg by mouth at bedtime.        [name] omeprazole (PRILOSEC) 20 MG capsule Take 20 mg by mouth daily.        . potassium chloride SA (K-DUR,KLOR-CON) 20 MEQ tablet TAKE THREE TABLETS BY MOUTH DAILY AS DIRECTED  90 tablet  0  . pravastatin (  PRAVACHOL) 40 MG tablet Take 40 mg by mouth daily.      [name] warfarin (COUMADIN) 4 MG tablet Take 4 mg by mouth daily.         No current facility-administered medications for this visit.    ALLERGIES:  has No Known Allergies.  REVIEW OF SYSTEMS:  The rest of the 14-point review of system was negative.   Filed Vitals:   02/10/13 0949  BP: 146/81  Pulse: 75  Temp: 97.4 F (36.3 C)  Resp: 19   Wt Readings from Last 3 Encounters:  02/10/13 177 lb 1.6 oz (80.332 kg)  11/04/12 175 lb (79.379 kg)  06/24/12 168 lb 12.8 oz (76.567 kg)   ECOG Performance status: 2  PHYSICAL EXAMINATION:   General: Mildly obese woman in no acute distress. Eyes: no scleral icterus. ENT: There were no oropharyngeal lesions. Neck was without thyromegaly.  Lymphatics: Negative cervical, supraclavicular or axillary adenopathy. Respiratory: lungs were clear bilaterally without wheezing or crackles. Cardiovascular: Regular rate and rhythm, S1/S2, without murmur, rub or gallop. There was no pedal edema. GI: abdomen was soft, flat, nontender, nondistended, without organomegaly. Muscoloskeletal: no spinal tenderness of palpation of vertebral spine. There was no tenderness or increased in temperature on palpation or right knee.Skin exam was without echymosis, petichae. Neuro exam was nonfocal. Patient needed assistance to get on and off exam table. Gait was wide spaced with a cane. Patient was alerted and oriented. Attention was good. Language was appropriate. Mood was normal without depression. Speech was not pressured. Thought content was not tangential.    LABORATORY/RADIOLOGY DATA:  Lab Results  Component Value Date   WBC 8.6 02/03/2013   HGB 12.3 02/03/2013   HCT 36.8 02/03/2013   PLT 261 02/03/2013   GLUCOSE 88 02/03/2013   ALKPHOS 62 02/03/2013   ALT 17 02/03/2013   AST 22 02/03/2013   NA 137 02/03/2013   K 3.8 02/03/2013   CL 102 10/16/2012   CREATININE 0.7 02/03/2013   BUN 10.7 02/03/2013   CO2 28 02/03/2013   INR 2.00 03/30/2009   Results for [facility], [facility] (MRN [id number]) as of 02/10/2013 09:49  Ref. Range 10/16/2012 10:52 02/03/2013 12:57 02/03/2013 12:57  M-SPIKE, % No range found 0.23  0.81   Results for [facility], Holden (MRN [id number]) as of 02/10/2013 09:49  Ref. Range 12/26/2011 13:24 04/20/2012 13:06 10/16/2012 10:52 02/03/2013 12:57 02/03/2013 12:57  Kappa free light chain Latest Range: 0.33-1.94 mg/dL 7.82 (H) 9.56 (H) 2.13 (H)  3.61 (H)   ASSESSMENT AND PLAN:   1. History of multiple myeloma: She has been in remission with Revlimid maintenance until it was stopped in 12/2011 after two years.  M spike showed slight increase but she continues to be asymptomatic. I offered her restarting Revlimid at a lower dose but elected to continue observation and  will repeat protein studies in 3 months.  2. Hyperlipidemia: She is on Pravastatin per PCP. 3. History of atrial fib: She is on digoxin and Coumadin anticoagulation in addition to diltiazem rate control per PCP. 4. Depression: Controlled with fluoxetine per PCP. Her mood was pleasant today.  5. COPD: stable; asymptomatic; on inhalers prn per PCP. 6. DJD:  Right knee, pending eval by ortho for replacement.  The possibility of recurrent myeloma should not prevent her from having right knee replacement since the prognosis of myeloma can be years.    [facility] MD 02/10/2013

## 2013-05-04 ENCOUNTER — Other Ambulatory Visit (HOSPITAL_BASED_OUTPATIENT_CLINIC_OR_DEPARTMENT_OTHER): Payer: Medicare Other

## 2013-05-04 ENCOUNTER — Encounter (INDEPENDENT_AMBULATORY_CARE_PROVIDER_SITE_OTHER): Payer: Self-pay

## 2013-05-04 DIAGNOSIS — C9001 Multiple myeloma in remission: Secondary | ICD-10-CM

## 2013-05-04 DIAGNOSIS — C9000 Multiple myeloma not having achieved remission: Secondary | ICD-10-CM

## 2013-05-04 LAB — COMPREHENSIVE METABOLIC PANEL (CC13)
Anion Gap: 10 mEq/L (ref 3–11)
BUN: 17.3 mg/dL (ref 7.0–26.0)
CO2: 26 mEq/L (ref 22–29)
Creatinine: 0.8 mg/dL (ref 0.6–1.1)
Glucose: 87 mg/dl (ref 70–140)
Total Bilirubin: 0.65 mg/dL (ref 0.20–1.20)
Total Protein: 8.7 g/dL — ABNORMAL HIGH (ref 6.4–8.3)

## 2013-05-04 LAB — CBC WITH DIFFERENTIAL/PLATELET
Basophils Absolute: 0 10*3/uL (ref 0.0–0.1)
Eosinophils Absolute: 0.1 10*3/uL (ref 0.0–0.5)
HCT: 36.3 % (ref 34.8–46.6)
LYMPH%: 23.9 % (ref 14.0–49.7)
MCV: 86.8 fL (ref 79.5–101.0)
MONO#: 1.1 10*3/uL — ABNORMAL HIGH (ref 0.1–0.9)
MONO%: 13.6 % (ref 0.0–14.0)
NEUT#: 5 10*3/uL (ref 1.5–6.5)
NEUT%: 61 % (ref 38.4–76.8)
Platelets: 295 10*3/uL (ref 145–400)
WBC: 8.2 10*3/uL (ref 3.9–10.3)

## 2013-05-06 LAB — SPEP & IFE WITH QIG
Alpha-1-Globulin: 4.7 % (ref 2.9–4.9)
Gamma Globulin: 23.8 % — ABNORMAL HIGH (ref 11.1–18.8)
IgA: 255 mg/dL (ref 69–380)
IgG (Immunoglobin G), Serum: 2040 mg/dL — ABNORMAL HIGH (ref 690–1700)
IgM, Serum: 43 mg/dL — ABNORMAL LOW (ref 52–322)
M-Spike, %: 1.3 g/dL
Total Protein, Serum Electrophoresis: 8.1 g/dL (ref 6.0–8.3)

## 2013-05-06 LAB — KAPPA/LAMBDA LIGHT CHAINS
Kappa free light chain: 5.88 mg/dL — ABNORMAL HIGH (ref 0.33–1.94)
Lambda Free Lght Chn: 1.51 mg/dL (ref 0.57–2.63)

## 2013-05-11 ENCOUNTER — Encounter: Payer: Self-pay | Admitting: *Deleted

## 2013-05-11 ENCOUNTER — Telehealth: Payer: Self-pay | Admitting: Oncology

## 2013-05-11 ENCOUNTER — Ambulatory Visit (HOSPITAL_BASED_OUTPATIENT_CLINIC_OR_DEPARTMENT_OTHER): Payer: Medicare Other | Admitting: Oncology

## 2013-05-11 VITALS — BP 128/76 | HR 57 | Temp 96.8°F | Resp 20 | Ht 61.3 in | Wt 177.3 lb

## 2013-05-11 DIAGNOSIS — I4891 Unspecified atrial fibrillation: Secondary | ICD-10-CM

## 2013-05-11 DIAGNOSIS — C9000 Multiple myeloma not having achieved remission: Secondary | ICD-10-CM

## 2013-05-11 DIAGNOSIS — J449 Chronic obstructive pulmonary disease, unspecified: Secondary | ICD-10-CM

## 2013-05-11 DIAGNOSIS — F329 Major depressive disorder, single episode, unspecified: Secondary | ICD-10-CM

## 2013-05-11 DIAGNOSIS — Z7901 Long term (current) use of anticoagulants: Secondary | ICD-10-CM

## 2013-05-11 DIAGNOSIS — E785 Hyperlipidemia, unspecified: Secondary | ICD-10-CM

## 2013-05-11 MED ORDER — LENALIDOMIDE 15 MG PO CAPS: 15.0000 mg | ORAL_CAPSULE | Freq: Every day | ORAL | Status: DC

## 2013-05-11 NOTE — Progress Notes (Signed)
RECEIVED A FAX FROM [facility] CONCERNING A PRIOR AUTHORIZATION FOR REVLIMID. THIS REQUEST WAS GIVEN TO MANA[facility]ED [facility]ARE.

## 2013-05-11 NOTE — Telephone Encounter (Signed)
gv adn printed appt sched and avs for pt for Dec °

## 2013-05-11 NOTE — Progress Notes (Signed)
[facility]  Telephone:(336) [phone] Fax:([phone]   OFFICE PROGRESS NOTE   Cc:  Brianna Holden  DIAGNOSIS:  79 year old with IgG kappa multiple myeloma with presenting abnormality of elevated total protein without anemia, hypercalcemia, or renal insufficiency. Her workup showed M-spike of 1.96 g/dL. Her bone marrow biopsy on 03/17/2009 showed 24% plasma cell. Cytogenetics were normal. FISH myeloma showed trisomy 11. Skeletal survey showed diffuse osteopenia.   PAST THERAPY: started on Revlimide day 1-21 /Dexamethasone weekly q4 weeks in February 2011 from which she achieved complete response her SPEP/serum free light chains.  She was on maintenance Revlimide 15mg  PO daily days 1-21 of every 28 days between June 2011 and June 2013 when it was discontinued after 2 years.   CURRENT THERAPY: watchful observation.   INTERVAL HISTORY: Brianna Holden 79 y.o. female returns for regular follow up visit. She is a very nice women formaly of Brianna Holden with the above diagnosis. She has moderate to severe bilateral knee pain (right worse than left). She cannot ambulate much or do any gardening or active chores due to severe knee pain.  She denied fever, open wound, erythema of the knees.  She has mild and stable SOB and DOE which improve with inhalers.  She denied recurrent infection, abd pain, chest pain, bleeding symptoms, lower back pain, lower extremity weakness, bowel or bladder incontinence.   No recent illness or hospitalizations. She has not reported any recent illnesses or pathological fractures.  Past Medical History  Diagnosis Date  . Myeloma   . COPD (chronic obstructive pulmonary disease)   . Osteopenia   . Hyperlipidemia   . Hypokalemia   . A-fib   . Depression     No past surgical history on file.  Current Outpatient Prescriptions  Medication Sig Dispense Refill  . budesonide (PULMICORT) 180 MCG/ACT inhaler Inhale 1 puff into the lungs 2 (two) times daily  as needed.        . celecoxib (CELEBREX) 100 MG capsule Take 100 mg by mouth daily. 1 to 2 caps as needed      . digoxin (LANOXIN) 0.25 MG tablet Take 250 mcg by mouth daily.        [name] diltiazem (CARDIZEM CD) 300 MG 24 hr capsule Take 300 mg by mouth daily.        [name] doxazosin (CARDURA) 2 MG tablet Take 2 mg by mouth at bedtime.      [name] FLUoxetine (PROZAC) 40 MG capsule Take 40 mg by mouth daily.        [name] HYDROcodone-acetaminophen (NORCO/VICODIN) 5-325 MG per tablet Take 1 tablet by mouth every 6 (six) hours as needed for pain.      [name] lenalidomide (REVLIMID) 15 MG capsule Take 1 capsule (15 mg total) by mouth daily.  21 capsule  1  . loperamide (IMODIUM) 2 MG capsule Take 2 mg by mouth 4 (four) times daily as needed.      [name] losartan-hydrochlorothiazide (HYZAAR) 100-25 MG per tablet Take 1 tablet by mouth daily.        . niacin (NIASPAN) 500 MG CR tablet Take 500 mg by mouth at bedtime.        [name] omeprazole (PRILOSEC) 20 MG capsule Take 20 mg by mouth daily.        . potassium chloride SA (K-DUR,KLOR-CON) 20 MEQ tablet TAKE THREE TABLETS BY MOUTH DAILY AS DIRECTED  90 tablet  0  . pravastatin (PRAVACHOL) 40 MG tablet Take 40 mg  by mouth daily.      [name] warfarin (COUMADIN) 4 MG tablet Take 4 mg by mouth daily.         No current facility-administered medications for this visit.    ALLERGIES:  has No Known Allergies.  REVIEW OF SYSTEMS:  The rest of the 14-point review of system was negative.   Filed Vitals:   05/11/13 1059  BP: 128/76  Pulse: 57  Temp: 96.8 F (36 C)  Resp: 20   Wt Readings from Last 3 Encounters:  05/11/13 177 lb 4.8 oz (80.423 kg)  02/10/13 177 lb 1.6 oz (80.332 kg)  11/04/12 175 lb (79.379 kg)   ECOG Performance status: 2  PHYSICAL EXAMINATION:   General: Mildly obese woman in no acute distress. Eyes: no scleral icterus. ENT: There were no oropharyngeal lesions. Neck was without thyromegaly. Lymphatics: Negative cervical, supraclavicular or axillary adenopathy.  Respiratory: lungs were clear bilaterally without wheezing or crackles. Cardiovascular: Regular rate and rhythm, S1/S2, without murmur, rub or gallop. There was no pedal edema. GI: abdomen was soft, flat, nontender, nondistended, without organomegaly. Muscoloskeletal: no spinal tenderness of palpation of vertebral spine. There was no tenderness or increased in temperature on palpation or right knee.Skin exam was without echymosis, petichae. Neuro exam was nonfocal. Patient needed assistance to get on and off exam table. Gait was wide spaced with a cane. Patient was alerted and oriented. Attention was good. Language was appropriate. Mood was normal without depression. Speech was not pressured. Thought content was not tangential.    LABORATORY/RADIOLOGY DATA:  Lab Results  Component Value Date   WBC 8.2 05/04/2013   HGB 11.9 05/04/2013   HCT 36.3 05/04/2013   PLT 295 05/04/2013   GLUCOSE 87 05/04/2013   ALKPHOS 72 05/04/2013   ALT 16 05/04/2013   AST 24 05/04/2013   NA 137 05/04/2013   K 3.4* 05/04/2013   CL 102 10/16/2012   CREATININE 0.8 05/04/2013   BUN 17.3 05/04/2013   CO2 26 05/04/2013   INR 2.00 03/30/2009   Results for Brianna Holden (MRN [id number]) as of 05/11/2013 10:48  Ref. Range 10/16/2012 10:52 02/03/2013 12:57 05/04/2013 12:47 05/04/2013 12:48  M-SPIKE, % No range found 0.23 0.81  1.30  Results for Brianna Holden (MRN [id number]) as of 05/11/2013 10:48  Ref. Range 02/03/2013 12:57 05/04/2013 12:47 05/04/2013 12:48  IgG (Immunoglobin G), Serum Latest Range: [phone] mg/dL 7829  5621 (H)    ASSESSMENT AND PLAN:   1. History of multiple myeloma: She has been in remission with Revlimid maintenance until it was stopped in 12/2011 after two years.  M spike showed  increase but she continues to be asymptomatic. I offered her restarting Revlimid at a lower dose and she is agreeable at this time. Risks and benefits were discussed again with the patient and complications associated with  this medication were also discussed. We will start her on 15 mg daily for 21 days and 7 days off. We'll evaluate her monthly after that to assess for any complications. 2. Hyperlipidemia: She is on Pravastatin per PCP. 3. History of atrial fib: She is on digoxin and Coumadin anticoagulation in addition to diltiazem rate control per PCP. 4. Depression: Controlled with fluoxetine per PCP. Her mood was pleasant today.  5. COPD: stable; asymptomatic; on inhalers prn per PCP. 6. DJD:  Right knee, pending eval by ortho for replacement.  The possibility of recurrent myeloma should not prevent her from having right knee replacement since the prognosis of myeloma can  be years.    [facility] MD 05/11/2013

## 2013-05-11 NOTE — Addendum Note (Signed)
Addended by: Reesa Chew on: 05/11/2013 12:04 PM   Modules accepted: Orders

## 2013-05-14 NOTE — Progress Notes (Signed)
PRIOR AUTHORIZATION HAS BEEN APPROVED.

## 2013-05-18 ENCOUNTER — Other Ambulatory Visit: Payer: Self-pay | Admitting: *Deleted

## 2013-05-18 DIAGNOSIS — C9000 Multiple myeloma not having achieved remission: Secondary | ICD-10-CM

## 2013-05-18 MED ORDER — POTASSIUM CHLORIDE CRYS ER 20 MEQ PO TBCR: EXTENDED_RELEASE_TABLET | ORAL | Status: DC

## 2013-05-18 NOTE — Progress Notes (Signed)
RECEIVED A FAX FROM [facility] CONCERNING A CONFIRMATION OF PRESCRIPTION SHIPMENT FOR REVLIMID ON 05/19/13.

## 2013-05-18 NOTE — Telephone Encounter (Signed)
Spoke with patient to confirm she is taking her K+ three times daily. She admits "I've been a bad girl. I have only been taking it once/day". Made her aware of her lab results and that she needs to at least take it twice daily on a consistent basis. Her co pay for #90 tabs is only $3.00. Reminded her she can dissolve the pills in water or applesauce. She reports she will "try to do better".

## 2013-06-01 ENCOUNTER — Other Ambulatory Visit: Payer: Self-pay | Admitting: *Deleted

## 2013-06-01 ENCOUNTER — Other Ambulatory Visit: Payer: Self-pay | Admitting: Medical Oncology

## 2013-06-01 ENCOUNTER — Telehealth: Payer: Self-pay | Admitting: *Deleted

## 2013-06-01 MED ORDER — PROCHLORPERAZINE MALEATE 10 MG PO TABS: 10.0000 mg | ORAL_TABLET | Freq: Four times a day (QID) | ORAL | Status: DC | PRN

## 2013-06-01 MED ORDER — NYSTATIN 100000 UNIT/GM EX POWD: CUTANEOUS | Status: DC

## 2013-06-01 NOTE — Telephone Encounter (Signed)
Per NP, Patient informed of prescription for compazine and nystatin powder escribed to her pharmacy. Patient expressed thanks, denies questions at this time and knows to call office with any questions or concerns.

## 2013-06-02 NOTE — Telephone Encounter (Signed)
No note

## 2013-06-08 ENCOUNTER — Other Ambulatory Visit: Payer: Self-pay | Admitting: *Deleted

## 2013-06-08 NOTE — Telephone Encounter (Signed)
THIS REFILL REQUEST FOR REVLIMID WAS PLACED IN DR.SHADAD'S ACTIVE WORK FOLDER. 

## 2013-06-09 ENCOUNTER — Other Ambulatory Visit: Payer: Self-pay | Admitting: *Deleted

## 2013-06-09 MED ORDER — LENALIDOMIDE 15 MG PO CAPS: 15.0000 mg | ORAL_CAPSULE | Freq: Every day | ORAL | Status: DC

## 2013-06-11 NOTE — Telephone Encounter (Signed)
RECEIVED A FAX FROM [facility] CONCERNING A CONFIRMATION OF PT.'S PRESCRIPTION BENEFITS. COPAY IS ZERO.

## 2013-06-18 ENCOUNTER — Ambulatory Visit (HOSPITAL_BASED_OUTPATIENT_CLINIC_OR_DEPARTMENT_OTHER): Payer: Medicare Other | Admitting: Oncology

## 2013-06-18 ENCOUNTER — Telehealth: Payer: Self-pay | Admitting: *Deleted

## 2013-06-18 ENCOUNTER — Encounter: Payer: Self-pay | Admitting: Oncology

## 2013-06-18 ENCOUNTER — Other Ambulatory Visit (HOSPITAL_BASED_OUTPATIENT_CLINIC_OR_DEPARTMENT_OTHER): Payer: Medicare Other

## 2013-06-18 VITALS — BP 143/88 | HR 79 | Temp 97.0°F | Resp 20 | Ht 61.3 in | Wt 177.1 lb

## 2013-06-18 DIAGNOSIS — J449 Chronic obstructive pulmonary disease, unspecified: Secondary | ICD-10-CM

## 2013-06-18 DIAGNOSIS — F329 Major depressive disorder, single episode, unspecified: Secondary | ICD-10-CM

## 2013-06-18 DIAGNOSIS — C9000 Multiple myeloma not having achieved remission: Secondary | ICD-10-CM

## 2013-06-18 DIAGNOSIS — I4891 Unspecified atrial fibrillation: Secondary | ICD-10-CM

## 2013-06-18 LAB — COMPREHENSIVE METABOLIC PANEL (CC13)
ALT: 13 U/L (ref 0–55)
Albumin: 3.2 g/dL — ABNORMAL LOW (ref 3.5–5.0)
Alkaline Phosphatase: 66 U/L (ref 40–150)
Anion Gap: 9 mEq/L (ref 3–11)
BUN: 13 mg/dL (ref 7.0–26.0)
CO2: 27 mEq/L (ref 22–29)
Calcium: 9.6 mg/dL (ref 8.4–10.4)
Chloride: 100 mEq/L (ref 98–109)
Glucose: 84 mg/dl (ref 70–140)
Potassium: 4 mEq/L (ref 3.5–5.1)
Sodium: 136 mEq/L (ref 136–145)
Total Protein: 8.7 g/dL — ABNORMAL HIGH (ref 6.4–8.3)

## 2013-06-18 LAB — CBC WITH DIFFERENTIAL/PLATELET
Basophils Absolute: 0.1 10*3/uL (ref 0.0–0.1)
EOS%: 1.3 % (ref 0.0–7.0)
Eosinophils Absolute: 0.1 10*3/uL (ref 0.0–0.5)
HCT: 36.4 % (ref 34.8–46.6)
LYMPH%: 31.3 % (ref 14.0–49.7)
MCH: 28.3 pg (ref 25.1–34.0)
MCV: 86.5 fL (ref 79.5–101.0)
MONO%: 18.9 % — ABNORMAL HIGH (ref 0.0–14.0)
NEUT#: 3.3 10*3/uL (ref 1.5–6.5)
NEUT%: 47.2 % (ref 38.4–76.8)
Platelets: 328 10*3/uL (ref 145–400)
RBC: 4.21 10*6/uL (ref 3.70–5.45)
RDW: 15.8 % — ABNORMAL HIGH (ref 11.2–14.5)
WBC: 7 10*3/uL (ref 3.9–10.3)

## 2013-06-18 NOTE — Progress Notes (Signed)
Brianna Holden  [phone] Fax:(336) [phone]   OFFICE PROGRESS NOTE   Cc:  Brianna Holden  DIAGNOSIS:  79 year old with IgG kappa multiple myeloma with presenting abnormality of elevated total protein without anemia, hypercalcemia, or renal insufficiency. Her workup showed M-spike of 1.96 g/dL. Her bone marrow biopsy on 03/17/2009 showed 24% plasma cell. Cytogenetics were normal. FISH myeloma showed trisomy 11. Skeletal survey showed diffuse osteopenia.   PAST THERAPY: started on Revlimide day 1-21 /Dexamethasone weekly q4 weeks in February 2011 from which she achieved complete response her SPEP/serum free light chains.  She was on maintenance Revlimide 15mg  PO daily days 1-21 of every 28 days between June 2011 and June 2013 when it was discontinued after 2 years.   CURRENT THERAPY: Revlimid 15 mg daily days 1-21 of a 28 day cycle started in November 2014.   INTERVAL HISTORY: Brianna Holden 79 y.o. female returns for regular follow up visit. She is a very nice women formally of Dr. Gaylyn Rong with the above diagnosis. She has moderate to severe bilateral knee pain (right worse than left). She cannot ambulate much or do any gardening or active chores due to severe knee pain.  She denied fever, open wound, erythema of the knees.  She has mild and stable SOB and DOE which improve with inhalers.  She denied recurrent infection, abd pain, chest pain, bleeding symptoms, lower back pain, lower extremity weakness, bowel or bladder incontinence.  No recent illness or hospitalizations. She has not reported any recent illnesses or pathological fractures. She resumed Revlimid last month and had mild nausea and diarrhea. Nausea and diarrhea controlled with medications.  Past Medical History  Diagnosis Date  . Myeloma   . COPD (chronic obstructive pulmonary disease)   . Osteopenia   . Hyperlipidemia   . Hypokalemia   . A-fib   . Depression     History reviewed. No pertinent  past surgical history.  Current Outpatient Prescriptions  Medication Sig Dispense Refill  . budesonide (PULMICORT) 180 MCG/ACT inhaler Inhale 1 puff into the lungs 2 (two) times daily as needed.        . celecoxib (CELEBREX) 100 MG capsule Take 100 mg by mouth daily. 1 to 2 caps as needed      . digoxin (LANOXIN) 0.25 MG tablet Take 250 mcg by mouth daily.        [name] diltiazem (CARDIZEM CD) 300 MG 24 hr capsule Take 300 mg by mouth daily.        [name] doxazosin (CARDURA) 2 MG tablet Take 2 mg by mouth at bedtime.      [name] FLUoxetine (PROZAC) 40 MG capsule Take 40 mg by mouth daily.        [name] HYDROcodone-acetaminophen (NORCO/VICODIN) 5-325 MG per tablet Take 1 tablet by mouth every 6 (six) hours as needed for pain.      [name] lenalidomide (REVLIMID) 15 MG capsule Take 1 capsule (15 mg total) by mouth daily. auth # [id number] faxed to [facility]. Brianna Holden  21 capsule  0  . loperamide (IMODIUM) 2 MG capsule Take 2 mg by mouth 4 (four) times daily as needed.      [name] losartan-hydrochlorothiazide (HYZAAR) 100-25 MG per tablet Take 1 tablet by mouth daily.        . niacin (NIASPAN) 500 MG CR tablet Take 500 mg by mouth at bedtime.        [name] nystatin (MYCOSTATIN/NYSTOP) 100000 UNIT/GM POWD Apply to rash twice  a day.  1 Bottle  0  . omeprazole (PRILOSEC) 20 MG capsule Take 20 mg by mouth daily.        . potassium chloride SA (K-DUR,KLOR-CON) 20 MEQ tablet TAKE THREE TABLETS BY MOUTH DAILY AS DIRECTED  90 tablet  1  . pravastatin (PRAVACHOL) 40 MG tablet Take 40 mg by mouth daily.      . prochlorperazine (COMPAZINE) 10 MG tablet Take 1 tablet (10 mg total) by mouth every 6 (six) hours as needed for nausea or vomiting.  60 tablet  0  . warfarin (COUMADIN) 4 MG tablet Take 4 mg by mouth daily.         No current facility-administered medications for this visit.    ALLERGIES:  has No Known Allergies.  REVIEW OF SYSTEMS:  The rest of the 14-point review of system was negative.   Filed Vitals:   06/18/13 1331   BP: 143/88  Pulse: 79  Temp: 97 F (36.1 C)  Resp: 20   Wt Readings from Last 3 Encounters:  06/18/13 177 lb 1.6 oz (80.332 kg)  05/11/13 177 lb 4.8 oz (80.423 kg)  02/10/13 177 lb 1.6 oz (80.332 kg)   ECOG Performance status: 2  PHYSICAL EXAMINATION:   General: Mildly obese woman in no acute distress. Eyes: no scleral icterus. ENT: There were no oropharyngeal lesions. Neck was without thyromegaly. Lymphatics: Negative cervical, supraclavicular or axillary adenopathy. Respiratory: lungs were clear bilaterally without wheezing or crackles. Cardiovascular: Regular rate and rhythm, S1/S2, without murmur, rub or gallop. There was no pedal edema. GI: abdomen was soft, flat, nontender, nondistended, without organomegaly. Muscoloskeletal: no spinal tenderness of palpation of vertebral spine. There was no tenderness or increased in temperature on palpation or right knee.Skin exam was without echymosis, petichae. Neuro exam was nonfocal. Patient needed assistance to get on and off exam table. Gait was wide spaced with a cane. Patient was alerted and oriented. Attention was good. Language was appropriate. Mood was normal without depression. Speech was not pressured. Thought content was not tangential.    LABORATORY/RADIOLOGY DATA:  Lab Results  Component Value Date   WBC 7.0 06/18/2013   HGB 11.9 06/18/2013   HCT 36.4 06/18/2013   PLT 328 06/18/2013   GLUCOSE 84 06/18/2013   ALKPHOS 66 06/18/2013   ALT 13 06/18/2013   AST 22 06/18/2013   NA 136 06/18/2013   K 4.0 06/18/2013   CL 102 10/16/2012   CREATININE 0.7 06/18/2013   BUN 13.0 06/18/2013   CO2 27 06/18/2013   INR 2.00 03/30/2009   Results for Brianna Holden (MRN [id number]) as of 05/11/2013 10:48  Ref. Range 10/16/2012 10:52 02/03/2013 12:57 05/04/2013 12:47 05/04/2013 12:48  M-SPIKE, % No range found 0.23 0.81  1.30  Results for Brianna Holden (MRN [id number]) as of 05/11/2013 10:48  Ref. Range 02/03/2013 12:57 05/04/2013 12:47 05/04/2013 12:48   IgG (Immunoglobin G), Serum Latest Range: [phone] mg/dL 7829  5621 (H)    ASSESSMENT AND PLAN:   1. History of multiple myeloma: She has been in remission with Revlimid maintenance until it was stopped in 12/2011 after two years.  M spike showed  increase but she continues to be asymptomatic. She has resumed Revlimid 15 mg daily for 21 days and 7 days off beginning in Nov 2014.  2. Hyperlipidemia: She is on Pravastatin per PCP. 3. History of atrial fib: She is on digoxin and Coumadin anticoagulation in addition to diltiazem rate control per PCP. 4. Depression: Controlled with fluoxetine  per PCP. Her mood was pleasant today.  5. COPD: stable; asymptomatic; on inhalers prn per PCP. 6. DJD:  Right knee, pending eval by ortho for replacement.  The possibility of recurrent myeloma should not prevent her from having right knee replacement since the prognosis of myeloma can be years.  7. Follow-up: In 4-5 weeks   Ayush Boulet 06/18/2013

## 2013-06-18 NOTE — Telephone Encounter (Signed)
Spoke with patient. Let her know that her potassium is normal.

## 2013-06-18 NOTE — Telephone Encounter (Signed)
Message copied by Reesa Chew on Fri Jun 18, 2013  4:57 PM ------      Message from: Myrtis Ser      Created: Fri Jun 18, 2013  3:33 PM      Regarding: Potassium level       Please call Ms Corradi and let her know that K+ level is normal. Continue KDur as she is taking.            Thanks ------

## 2013-07-06 ENCOUNTER — Other Ambulatory Visit: Payer: Self-pay | Admitting: *Deleted

## 2013-07-06 DIAGNOSIS — C9000 Multiple myeloma not having achieved remission: Secondary | ICD-10-CM

## 2013-07-06 MED ORDER — LENALIDOMIDE 15 MG PO CAPS: 15.0000 mg | ORAL_CAPSULE | Freq: Every day | ORAL | Status: DC

## 2013-07-06 NOTE — Telephone Encounter (Signed)
THIS REFILL REQUEST FOR REVLIMID WAS PLACED IN DR.SHADAD'S ACTIVE WORK FOLDER. 

## 2013-07-09 NOTE — Telephone Encounter (Signed)
RECEIVED A FAX FROM [facility] CONCERNING A CONFIRMATION OF PRESCRIPTION SHIPMENT FOR REVLIMID ON 07/12/13.

## 2013-07-23 ENCOUNTER — Telehealth: Payer: Self-pay | Admitting: Oncology

## 2013-07-23 ENCOUNTER — Other Ambulatory Visit (HOSPITAL_BASED_OUTPATIENT_CLINIC_OR_DEPARTMENT_OTHER): Payer: Medicare Other

## 2013-07-23 ENCOUNTER — Ambulatory Visit (HOSPITAL_BASED_OUTPATIENT_CLINIC_OR_DEPARTMENT_OTHER): Payer: Medicare Other | Admitting: Oncology

## 2013-07-23 ENCOUNTER — Encounter: Payer: Self-pay | Admitting: Oncology

## 2013-07-23 VITALS — BP 119/72 | HR 86 | Temp 97.6°F | Resp 17 | Ht 61.0 in | Wt 175.0 lb

## 2013-07-23 DIAGNOSIS — F329 Major depressive disorder, single episode, unspecified: Secondary | ICD-10-CM

## 2013-07-23 DIAGNOSIS — C9000 Multiple myeloma not having achieved remission: Secondary | ICD-10-CM

## 2013-07-23 DIAGNOSIS — J449 Chronic obstructive pulmonary disease, unspecified: Secondary | ICD-10-CM

## 2013-07-23 DIAGNOSIS — I4891 Unspecified atrial fibrillation: Secondary | ICD-10-CM

## 2013-07-23 DIAGNOSIS — F3289 Other specified depressive episodes: Secondary | ICD-10-CM

## 2013-07-23 DIAGNOSIS — E785 Hyperlipidemia, unspecified: Secondary | ICD-10-CM

## 2013-07-23 LAB — CBC WITH DIFFERENTIAL/PLATELET
BASO%: 0.6 % (ref 0.0–2.0)
BASOS ABS: 0 10*3/uL (ref 0.0–0.1)
EOS ABS: 0.1 10*3/uL (ref 0.0–0.5)
EOS%: 2.3 % (ref 0.0–7.0)
HEMATOCRIT: 38.1 % (ref 34.8–46.6)
HEMOGLOBIN: 12.4 g/dL (ref 11.6–15.9)
LYMPH#: 1.6 10*3/uL (ref 0.9–3.3)
LYMPH%: 25.2 % (ref 14.0–49.7)
MCH: 28 pg (ref 25.1–34.0)
MCHC: 32.7 g/dL (ref 31.5–36.0)
MCV: 85.8 fL (ref 79.5–101.0)
MONO#: 0.5 10*3/uL (ref 0.1–0.9)
MONO%: 8.3 % (ref 0.0–14.0)
NEUT#: 4.1 10*3/uL (ref 1.5–6.5)
NEUT%: 63.6 % (ref 38.4–76.8)
Platelets: 284 10*3/uL (ref 145–400)
RBC: 4.44 10*6/uL (ref 3.70–5.45)
RDW: 17.4 % — ABNORMAL HIGH (ref 11.2–14.5)
WBC: 6.4 10*3/uL (ref 3.9–10.3)

## 2013-07-23 LAB — COMPREHENSIVE METABOLIC PANEL (CC13)
ALT: 15 U/L (ref 0–55)
AST: 19 U/L (ref 5–34)
Albumin: 3.2 g/dL — ABNORMAL LOW (ref 3.5–5.0)
Alkaline Phosphatase: 75 U/L (ref 40–150)
Anion Gap: 10 mEq/L (ref 3–11)
BUN: 12 mg/dL (ref 7.0–26.0)
CALCIUM: 9.1 mg/dL (ref 8.4–10.4)
CHLORIDE: 101 meq/L (ref 98–109)
CO2: 25 mEq/L (ref 22–29)
Creatinine: 0.8 mg/dL (ref 0.6–1.1)
Glucose: 104 mg/dl (ref 70–140)
Potassium: 3.8 mEq/L (ref 3.5–5.1)
Sodium: 136 mEq/L (ref 136–145)
Total Bilirubin: 0.57 mg/dL (ref 0.20–1.20)
Total Protein: 8.2 g/dL (ref 6.4–8.3)

## 2013-07-23 NOTE — Progress Notes (Signed)
[place]  Telephone:([phone] Fax:(336) [phone]   OFFICE PROGRESS NOTE   Cc:  [name]  DIAGNOSIS:  80 year old with IgG kappa multiple myeloma with presenting abnormality of elevated total protein without anemia, hypercalcemia, or renal insufficiency. Her workup showed M-spike of 1.96 g/dL. Her bone marrow biopsy on 03/17/2009 showed 24% plasma cell. Cytogenetics were normal. FISH myeloma showed trisomy 11. Skeletal survey showed diffuse osteopenia.   PAST THERAPY: started on Revlimide day 1-21 /Dexamethasone weekly q4 weeks in February 2011 from which she achieved complete response her SPEP/serum free light chains.  She was on maintenance Revlimide 68m PO daily days 1-21 of every 28 days between June 2011 and June 2013 when it was discontinued after 2 years.   CURRENT THERAPY: Revlimid 15 mg daily days 1-21 of a 28 day cycle started in November 2014.   INTERVAL HISTORY: Patient returns for regular follow up visit. She is not reporting any new complaints since the last visit.  She denied fever, open wound, erythema of the knees.  She has mild and stable SOB and DOE which improve with inhalers.  She denied recurrent infection, abd pain, chest pain, bleeding symptoms, lower back pain, lower extremity weakness, bowel or bladder incontinence.  No recent illness or hospitalizations. She has not reported any recent illnesses or pathological fractures. She resumed Revlimid l and had mild nausea and diarrhea. Nausea and diarrhea controlled with medications. Has not had any hospitalization or illnesses.  Past Medical History  Diagnosis Date  . Myeloma   . COPD (chronic obstructive pulmonary disease)   . Osteopenia   . Hyperlipidemia   . Hypokalemia   . A-fib   . Depression     No past surgical history on file.  Current Outpatient Prescriptions  Medication Sig Dispense Refill  . budesonide (PULMICORT) 180 MCG/ACT inhaler Inhale 1 puff into the lungs 2  (two) times daily as needed.        . celecoxib (CELEBREX) 100 MG capsule Take 100 mg by mouth daily. 1 to 2 caps as needed      . digoxin (LANOXIN) 0.25 MG tablet Take 250 mcg by mouth daily.        .Marland Kitchendiltiazem (CARDIZEM CD) 300 MG 24 hr capsule Take 300 mg by mouth daily.        .Marland Kitchendoxazosin (CARDURA) 2 MG tablet Take 2 mg by mouth at bedtime.      .Marland KitchenFLUoxetine (PROZAC) 40 MG capsule Take 40 mg by mouth daily.        . furosemide (LASIX) 20 MG tablet Take 20 mg by mouth as needed.      .Marland KitchenHYDROcodone-acetaminophen (NORCO/VICODIN) 5-325 MG per tablet Take 1 tablet by mouth every 6 (six) hours as needed for pain.      .Marland Kitchenlenalidomide (REVLIMID) 15 MG capsule Take 1 capsule (15 mg total) by mouth daily. Take 15 mg daily for 21 days.  21 capsule  0  . loperamide (IMODIUM) 2 MG capsule Take 2 mg by mouth 4 (four) times daily as needed.      .Marland Kitchenlosartan-hydrochlorothiazide (HYZAAR) 100-25 MG per tablet Take 1 tablet by mouth daily.        . niacin (NIASPAN) 500 MG CR tablet Take 500 mg by mouth at bedtime.        .Marland Kitchennystatin (MYCOSTATIN/NYSTOP) 100000 UNIT/GM POWD Apply to rash twice a day.  1 Bottle  0  . omeprazole (PRILOSEC) 20 MG capsule Take 20  mg by mouth daily.        . potassium chloride SA (K-DUR,KLOR-CON) 20 MEQ tablet TAKE THREE TABLETS BY MOUTH DAILY AS DIRECTED  90 tablet  1  . pravastatin (PRAVACHOL) 40 MG tablet Take 40 mg by mouth daily.      . prochlorperazine (COMPAZINE) 10 MG tablet Take 1 tablet (10 mg total) by mouth every 6 (six) hours as needed for nausea or vomiting.  60 tablet  0  . VENTOLIN HFA 108 (90 BASE) MCG/ACT inhaler Inhale 1 puff into the lungs daily.      [name] warfarin (COUMADIN) 4 MG tablet Take 4 mg by mouth daily.         No current facility-administered medications for this visit.    ALLERGIES:  has No Known Allergies.  REVIEW OF SYSTEMS:  The rest of the 14-point review of system was negative.   Filed Vitals:   07/23/13 1325  BP: 119/72  Pulse: 86  Temp:  97.6 F (36.4 C)  Resp: 17   Wt Readings from Last 3 Encounters:  07/23/13 175 lb (79.379 kg)  06/18/13 177 lb 1.6 oz (80.332 kg)  05/11/13 177 lb 4.8 oz (80.423 kg)   ECOG Performance status: 2  PHYSICAL EXAMINATION:   General: Mildly obese woman in no acute distress. Eyes: no scleral icterus. ENT: There were no oropharyngeal lesions. Neck was without thyromegaly. Lymphatics: Negative cervical, supraclavicular or axillary adenopathy. Respiratory: lungs were clear bilaterally without wheezing or crackles. Cardiovascular: Regular rate and rhythm, S1/S2, without murmur, rub or gallop. There was no pedal edema. GI: abdomen was soft, flat, nontender, nondistended, without organomegaly. Muscoloskeletal: no spinal tenderness of palpation of vertebral spine. There was no tenderness or increased in temperature on palpation or right knee.Skin exam was without echymosis, petichae. Neuro exam was nonfocal. Patient needed assistance to get on and off exam table. Gait was wide spaced with a cane. Patient was alerted and oriented. Not in any distress.    LABORATORY/RADIOLOGY DATA:  Lab Results  Component Value Date   WBC 6.4 07/23/2013   HGB 12.4 07/23/2013   HCT 38.1 07/23/2013   PLT 284 07/23/2013   GLUCOSE 84 06/18/2013   ALKPHOS 66 06/18/2013   ALT 13 06/18/2013   AST 22 06/18/2013   NA 136 06/18/2013   K 4.0 06/18/2013   CL 102 10/16/2012   CREATININE 0.7 06/18/2013   BUN 13.0 06/18/2013   CO2 27 06/18/2013   INR 2.00 03/30/2009     ASSESSMENT AND PLAN:   1. History of multiple myeloma: She has been in remission with Revlimid maintenance until it was stopped in 12/2011 after two years.  M spike showed  increase but she continues to be asymptomatic. She has resumed Revlimid 15 mg daily for 21 days and 7 days off beginning in Nov 2014. Her light chain studies were obtained today and will be reviewed upon completion. She tolerated the medication without any major complication and we will consider dose  reduction if she does have any issues in the future. 2. Hyperlipidemia: She is on Pravastatin per PCP. 3. History of atrial fib: She is on digoxin and Coumadin anticoagulation in addition to diltiazem rate control per PCP. 4. Depression: Controlled with fluoxetine per PCP. Her mood was pleasant today.  5. COPD: stable; asymptomatic; on inhalers prn per PCP. 6. DJD:  Right knee, pending eval by ortho for replacement.  The possibility of recurrent myeloma should not prevent her from having right knee replacement since the prognosis  of myeloma can be years.  7. Follow-up: In 4-5 weeks   [facility] 07/23/2013

## 2013-07-23 NOTE — Telephone Encounter (Signed)
Gave pt appt for lab and MD on February 2015 °

## 2013-07-27 LAB — SPEP & IFE WITH QIG
Albumin ELP: 44.2 % — ABNORMAL LOW (ref 55.8–66.1)
Alpha-1-Globulin: 6.4 % — ABNORMAL HIGH (ref 2.9–4.9)
Alpha-2-Globulin: 13.4 % — ABNORMAL HIGH (ref 7.1–11.8)
BETA 2: 5.4 % (ref 3.2–6.5)
BETA GLOBULIN: 7.1 % (ref 4.7–7.2)
Gamma Globulin: 23.5 % — ABNORMAL HIGH (ref 11.1–18.8)
IGA: 352 mg/dL (ref 69–380)
IGG (IMMUNOGLOBIN G), SERUM: 2180 mg/dL — AB (ref 690–1700)
IgM, Serum: 36 mg/dL — ABNORMAL LOW (ref 52–322)
M-SPIKE, %: 1.27 g/dL
Total Protein, Serum Electrophoresis: 7.9 g/dL (ref 6.0–8.3)

## 2013-07-27 LAB — KAPPA/LAMBDA LIGHT CHAINS
Kappa free light chain: 8.92 mg/dL — ABNORMAL HIGH (ref 0.33–1.94)
Kappa:Lambda Ratio: 3.16 — ABNORMAL HIGH (ref 0.26–1.65)
Lambda Free Lght Chn: 2.82 mg/dL — ABNORMAL HIGH (ref 0.57–2.63)

## 2013-08-06 ENCOUNTER — Other Ambulatory Visit: Payer: Self-pay | Admitting: *Deleted

## 2013-08-06 NOTE — Telephone Encounter (Signed)
THIS REFILL REQUEST FOR REVLIMID WAS PLACED IN DR.SHADAD'S ACTIVE WORK FOLDER. 

## 2013-08-09 ENCOUNTER — Other Ambulatory Visit: Payer: Self-pay | Admitting: *Deleted

## 2013-08-09 DIAGNOSIS — C9000 Multiple myeloma not having achieved remission: Secondary | ICD-10-CM

## 2013-08-09 MED ORDER — LENALIDOMIDE 15 MG PO CAPS: 15.0000 mg | ORAL_CAPSULE | Freq: Every day | ORAL | Status: DC

## 2013-08-09 NOTE — Telephone Encounter (Signed)
Faxed refill for revlimid to [facility] # [phone]

## 2013-08-09 NOTE — Telephone Encounter (Signed)
RECEIVED A FAX FROM [facility] CONCERNING A CONFIRMATION OF PT.'S PRESCRIPTION BENEFITS. PT.'S COPAY AMOUNT IS ZERO.

## 2013-08-10 NOTE — Telephone Encounter (Signed)
RECEIVED A FAX FROM [facility] CONCERNING A CONFIRMATION OF PRESCRIPTION SHIPMENT FOR REVLIMID ON 08/11/13.

## 2013-08-18 ENCOUNTER — Other Ambulatory Visit (HOSPITAL_BASED_OUTPATIENT_CLINIC_OR_DEPARTMENT_OTHER): Payer: Medicare Other

## 2013-08-18 ENCOUNTER — Ambulatory Visit (HOSPITAL_BASED_OUTPATIENT_CLINIC_OR_DEPARTMENT_OTHER): Payer: Medicare Other | Admitting: Oncology

## 2013-08-18 ENCOUNTER — Telehealth: Payer: Self-pay | Admitting: Oncology

## 2013-08-18 VITALS — BP 127/72 | HR 109 | Temp 97.0°F | Resp 18 | Ht 61.0 in | Wt 175.9 lb

## 2013-08-18 DIAGNOSIS — E785 Hyperlipidemia, unspecified: Secondary | ICD-10-CM

## 2013-08-18 DIAGNOSIS — C9000 Multiple myeloma not having achieved remission: Secondary | ICD-10-CM

## 2013-08-18 DIAGNOSIS — I4891 Unspecified atrial fibrillation: Secondary | ICD-10-CM

## 2013-08-18 DIAGNOSIS — C9001 Multiple myeloma in remission: Secondary | ICD-10-CM

## 2013-08-18 DIAGNOSIS — J449 Chronic obstructive pulmonary disease, unspecified: Secondary | ICD-10-CM

## 2013-08-18 LAB — CBC WITH DIFFERENTIAL/PLATELET
BASO%: 0.5 % (ref 0.0–2.0)
Basophils Absolute: 0 10*3/uL (ref 0.0–0.1)
EOS%: 1.8 % (ref 0.0–7.0)
Eosinophils Absolute: 0.1 10*3/uL (ref 0.0–0.5)
HCT: 36.6 % (ref 34.8–46.6)
HGB: 12 g/dL (ref 11.6–15.9)
LYMPH#: 1.8 10*3/uL (ref 0.9–3.3)
LYMPH%: 25.7 % (ref 14.0–49.7)
MCH: 28.5 pg (ref 25.1–34.0)
MCHC: 32.9 g/dL (ref 31.5–36.0)
MCV: 86.6 fL (ref 79.5–101.0)
MONO#: 0.5 10*3/uL (ref 0.1–0.9)
MONO%: 7.3 % (ref 0.0–14.0)
NEUT#: 4.6 10*3/uL (ref 1.5–6.5)
NEUT%: 64.7 % (ref 38.4–76.8)
Platelets: 265 10*3/uL (ref 145–400)
RBC: 4.23 10*6/uL (ref 3.70–5.45)
RDW: 19.3 % — AB (ref 11.2–14.5)
WBC: 7.1 10*3/uL (ref 3.9–10.3)

## 2013-08-18 LAB — COMPREHENSIVE METABOLIC PANEL (CC13)
ALT: 15 U/L (ref 0–55)
AST: 21 U/L (ref 5–34)
Albumin: 3.3 g/dL — ABNORMAL LOW (ref 3.5–5.0)
Alkaline Phosphatase: 64 U/L (ref 40–150)
Anion Gap: 9 mEq/L (ref 3–11)
BILIRUBIN TOTAL: 0.52 mg/dL (ref 0.20–1.20)
BUN: 14.3 mg/dL (ref 7.0–26.0)
CHLORIDE: 101 meq/L (ref 98–109)
CO2: 28 mEq/L (ref 22–29)
Calcium: 9.6 mg/dL (ref 8.4–10.4)
Creatinine: 0.8 mg/dL (ref 0.6–1.1)
Glucose: 107 mg/dl (ref 70–140)
Potassium: 3.3 mEq/L — ABNORMAL LOW (ref 3.5–5.1)
SODIUM: 138 meq/L (ref 136–145)
TOTAL PROTEIN: 8.4 g/dL — AB (ref 6.4–8.3)

## 2013-08-18 NOTE — Progress Notes (Signed)
[place]  [phone] [phone]   OFFICE PROGRESS NOTE   Cc:  [name]  DIAGNOSIS:  80 year old with IgG kappa multiple myeloma with presenting abnormality of elevated total protein without anemia, hypercalcemia, or renal insufficiency. Her workup showed M-spike of 1.96 g/dL. Her bone marrow biopsy on 03/17/2009 showed 24% plasma cell. Cytogenetics were normal. FISH myeloma showed trisomy 11. Skeletal survey showed diffuse osteopenia.   PAST THERAPY: started on Revlimide day 1-21 /Dexamethasone weekly q4 weeks in February 2011 from which she achieved complete response her SPEP/serum free light chains.  She was on maintenance Revlimide 60m PO daily days 1-21 of every 28 days between June 2011 and June 2013 when it was discontinued after 2 years.   CURRENT THERAPY: Revlimid 15 mg daily days 1-21 of a 28 day cycle started in November 2014.   INTERVAL HISTORY: Patient returns for regular follow up visit. She is not reporting any new complaints since the last visit.  She denied fever, open wound, erythema of the knees.  She has mild and stable SOB and DOE which improve with inhalers.  She denied recurrent infection, abd pain, chest pain, bleeding symptoms, lower back pain, lower extremity weakness, bowel or bladder incontinence.She has not reported any recent illnesses or pathological fractures. She resumed Revlimid l and had mild nausea and diarrhea. Nausea and diarrhea controlled with medications. Has not had any hospitalization or illnesses. He has not reported any new complications and continue to perform activities of daily living. Her mobility slightly limited with arthritis but no other hospitalizations.  Past Medical History  Diagnosis Date  . Myeloma   . COPD (chronic obstructive pulmonary disease)   . Osteopenia   . Hyperlipidemia   . Hypokalemia   . A-fib   . Depression     No past surgical history on file.  Current  Outpatient Prescriptions  Medication Sig Dispense Refill  . budesonide (PULMICORT) 180 MCG/ACT inhaler Inhale 1 puff into the lungs 2 (two) times daily as needed.        . celecoxib (CELEBREX) 100 MG capsule Take 100 mg by mouth daily. 1 to 2 caps as needed      . digoxin (LANOXIN) 0.25 MG tablet Take 250 mcg by mouth daily.        .Marland Kitchendiltiazem (CARDIZEM CD) 300 MG 24 hr capsule Take 300 mg by mouth daily.        .Marland Kitchendoxazosin (CARDURA) 2 MG tablet Take 2 mg by mouth at bedtime.      .Marland KitchenFLUoxetine (PROZAC) 40 MG capsule Take 40 mg by mouth daily.        . furosemide (LASIX) 20 MG tablet Take 20 mg by mouth as needed.      .Marland KitchenHYDROcodone-acetaminophen (NORCO/VICODIN) 5-325 MG per tablet Take 1 tablet by mouth every 6 (six) hours as needed for pain.      .Marland Kitchenlenalidomide (REVLIMID) 15 MG capsule Take 1 capsule (15 mg total) by mouth daily. Take 15 mg daily for 21 days.  21 capsule  0  . loperamide (IMODIUM) 2 MG capsule Take 2 mg by mouth 4 (four) times daily as needed.      .Marland Kitchenlosartan-hydrochlorothiazide (HYZAAR) 100-25 MG per tablet Take 1 tablet by mouth daily.        . niacin (NIASPAN) 500 MG CR tablet Take 500 mg by mouth at bedtime.        .Marland Kitchennystatin (MYCOSTATIN/NYSTOP) 100000 UNIT/GM POWD Apply to  rash twice a day.  1 Bottle  0  . omeprazole (PRILOSEC) 20 MG capsule Take 20 mg by mouth daily.        . potassium chloride SA (K-DUR,KLOR-CON) 20 MEQ tablet TAKE THREE TABLETS BY MOUTH DAILY AS DIRECTED  90 tablet  1  . pravastatin (PRAVACHOL) 40 MG tablet Take 40 mg by mouth daily.      . prochlorperazine (COMPAZINE) 10 MG tablet Take 1 tablet (10 mg total) by mouth every 6 (six) hours as needed for nausea or vomiting.  60 tablet  0  . VENTOLIN HFA 108 (90 BASE) MCG/ACT inhaler Inhale 1 puff into the lungs daily.      [name] warfarin (COUMADIN) 4 MG tablet Take 4 mg by mouth daily.         No current facility-administered medications for this visit.    ALLERGIES:  has No Known Allergies.  REVIEW OF  SYSTEMS:  The rest of the 14-point review of system was negative.   Filed Vitals:   08/18/13 1057  BP: 127/72  Pulse: 109  Temp: 97 F (36.1 C)  Resp: 18   Wt Readings from Last 3 Encounters:  08/18/13 175 lb 14.4 oz (79.788 kg)  07/23/13 175 lb (79.379 kg)  06/18/13 177 lb 1.6 oz (80.332 kg)   ECOG Performance status: 2  PHYSICAL EXAMINATION:   General: Mildly obese woman in no acute distress. Eyes: no scleral icterus. ENT: There were no oropharyngeal lesions. Neck was without thyromegaly. Lymphatics: Negative cervical, supraclavicular or axillary adenopathy. Respiratory: lungs were clear bilaterally without wheezing or crackles. Cardiovascular: Regular rate and rhythm, S1/S2, without murmur, rub or gallop. There was no pedal edema. GI: abdomen was soft, flat, nontender, nondistended, without organomegaly. Muscoloskeletal: no spinal tenderness of palpation of vertebral spine. There was no tenderness or increased in temperature on palpation or right knee.Skin exam was without echymosis, petichae. Neuro exam was nonfocal. Patient needed assistance to get on and off exam table. Gait was wide spaced with a cane. Patient was alerted and oriented. Not in any distress.    LABORATORY/RADIOLOGY DATA:  Lab Results  Component Value Date   WBC 7.1 08/18/2013   HGB 12.0 08/18/2013   HCT 36.6 08/18/2013   PLT 265 08/18/2013   GLUCOSE 104 07/23/2013   ALKPHOS 75 07/23/2013   ALT 15 07/23/2013   AST 19 07/23/2013   NA 136 07/23/2013   K 3.8 07/23/2013   CL 102 10/16/2012   CREATININE 0.8 07/23/2013   BUN 12.0 07/23/2013   CO2 25 07/23/2013   INR 2.00 03/30/2009   Results for CARRERA, KIESEL (MRN [id number]) as of 08/18/2013 11:16  Ref. Range 05/04/2013 12:48 06/18/2013 13:14 07/23/2013 13:11  M-SPIKE, % No range found 1.30  1.27  SPE Interp. No range found *  *  IgG (Immunoglobin G), Serum Latest Range: [phone] mg/dL 2040 (H)  2180 (H)    ASSESSMENT AND PLAN:   1. History of multiple myeloma: She has been in  remission with Revlimid maintenance until it was stopped in 12/2011 after two years.  M spike showed  increase but she continues to be asymptomatic. She has resumed Revlimid 15 mg daily for 21 days and 7 days off beginning in Nov 2014. Her light chain studies slightly increased well we'll continue to monitor. She tolerated the medication without any major complication and we will consider dose reduction if she does have any issues in the future. 2. Hyperlipidemia: She is on Pravastatin per PCP. 3. History  of atrial fib: She is on digoxin and Coumadin anticoagulation in addition to diltiazem rate control per PCP. 4. Depression: Controlled with fluoxetine per PCP. Her mood was pleasant today.  5. COPD: stable; asymptomatic; on inhalers prn per PCP. 6. DJD:  Right knee, pending eval by ortho for replacement.  The possibility of recurrent myeloma should not prevent her from having right knee replacement since the prognosis of myeloma can be years.  7. Follow-up: In 4-5 weeks   [facility] 08/18/2013

## 2013-08-18 NOTE — Telephone Encounter (Signed)
Gave pt appt for ML on march 2015 with labs

## 2013-08-23 LAB — SPEP & IFE WITH QIG
ALPHA-1-GLOBULIN: 5.2 % — AB (ref 2.9–4.9)
ALPHA-2-GLOBULIN: 12.5 % — AB (ref 7.1–11.8)
Albumin ELP: 45.1 % — ABNORMAL LOW (ref 55.8–66.1)
Beta 2: 4.9 % (ref 3.2–6.5)
Beta Globulin: 6.9 % (ref 4.7–7.2)
GAMMA GLOBULIN: 25.4 % — AB (ref 11.1–18.8)
IgA: 360 mg/dL (ref 69–380)
IgG (Immunoglobin G), Serum: 2530 mg/dL — ABNORMAL HIGH (ref 690–1700)
IgM, Serum: 33 mg/dL — ABNORMAL LOW (ref 52–322)
M-Spike, %: 1.6 g/dL
Total Protein, Serum Electrophoresis: 8.4 g/dL — ABNORMAL HIGH (ref 6.0–8.3)

## 2013-08-23 LAB — KAPPA/LAMBDA LIGHT CHAINS
KAPPA FREE LGHT CHN: 14.3 mg/dL — AB (ref 0.33–1.94)
KAPPA LAMBDA RATIO: 5.93 — AB (ref 0.26–1.65)
Lambda Free Lght Chn: 2.41 mg/dL (ref 0.57–2.63)

## 2013-09-01 ENCOUNTER — Other Ambulatory Visit: Payer: Self-pay | Admitting: Medical Oncology

## 2013-09-01 ENCOUNTER — Other Ambulatory Visit: Payer: Self-pay | Admitting: *Deleted

## 2013-09-01 DIAGNOSIS — C9000 Multiple myeloma not having achieved remission: Secondary | ICD-10-CM

## 2013-09-01 MED ORDER — LENALIDOMIDE 15 MG PO CAPS: 15.0000 mg | ORAL_CAPSULE | Freq: Every day | ORAL | Status: DC

## 2013-09-01 NOTE — Telephone Encounter (Signed)
Revlimid prescription refill faxed to [facility] @ [phone]. Authorization # [id number]

## 2013-09-01 NOTE — Telephone Encounter (Signed)
Refill request to MD desk for signature. 

## 2013-09-02 NOTE — Telephone Encounter (Signed)
RECEIVED A FAX FROM [facility] CONCERNING A CONFIRMATION OF PT.'S PRESCRIPTION BENEFITS. PT.'S COPAY IS ZERO.

## 2013-09-06 ENCOUNTER — Other Ambulatory Visit: Payer: Self-pay | Admitting: *Deleted

## 2013-09-06 NOTE — Telephone Encounter (Signed)
RECEIVED A FAX FROM [facility] CONCERNING A CONFIRMATION OF PRESCRIPTION SHIPMENT FOR REVLIMID ON 09/07/13.

## 2013-09-17 ENCOUNTER — Ambulatory Visit (HOSPITAL_BASED_OUTPATIENT_CLINIC_OR_DEPARTMENT_OTHER): Payer: Medicare Other | Admitting: Oncology

## 2013-09-17 ENCOUNTER — Encounter: Payer: Self-pay | Admitting: Oncology

## 2013-09-17 ENCOUNTER — Telehealth: Payer: Self-pay | Admitting: Oncology

## 2013-09-17 ENCOUNTER — Other Ambulatory Visit (HOSPITAL_BASED_OUTPATIENT_CLINIC_OR_DEPARTMENT_OTHER): Payer: Medicare Other

## 2013-09-17 VITALS — BP 120/70 | HR 78 | Temp 97.8°F | Resp 18 | Ht 61.0 in | Wt 173.4 lb

## 2013-09-17 DIAGNOSIS — C9000 Multiple myeloma not having achieved remission: Secondary | ICD-10-CM

## 2013-09-17 DIAGNOSIS — E876 Hypokalemia: Secondary | ICD-10-CM

## 2013-09-17 DIAGNOSIS — F329 Major depressive disorder, single episode, unspecified: Secondary | ICD-10-CM

## 2013-09-17 DIAGNOSIS — F3289 Other specified depressive episodes: Secondary | ICD-10-CM

## 2013-09-17 DIAGNOSIS — I4891 Unspecified atrial fibrillation: Secondary | ICD-10-CM

## 2013-09-17 LAB — COMPREHENSIVE METABOLIC PANEL (CC13)
ALK PHOS: 65 U/L (ref 40–150)
ALT: 15 U/L (ref 0–55)
AST: 23 U/L (ref 5–34)
Albumin: 3.3 g/dL — ABNORMAL LOW (ref 3.5–5.0)
Anion Gap: 9 mEq/L (ref 3–11)
BILIRUBIN TOTAL: 0.47 mg/dL (ref 0.20–1.20)
BUN: 11.8 mg/dL (ref 7.0–26.0)
CO2: 29 mEq/L (ref 22–29)
Calcium: 9.2 mg/dL (ref 8.4–10.4)
Chloride: 100 mEq/L (ref 98–109)
Creatinine: 0.8 mg/dL (ref 0.6–1.1)
GLUCOSE: 91 mg/dL (ref 70–140)
Potassium: 2.9 mEq/L — CL (ref 3.5–5.1)
Sodium: 139 mEq/L (ref 136–145)
Total Protein: 9 g/dL — ABNORMAL HIGH (ref 6.4–8.3)

## 2013-09-17 LAB — CBC WITH DIFFERENTIAL/PLATELET
BASO%: 0.6 % (ref 0.0–2.0)
Basophils Absolute: 0 10*3/uL (ref 0.0–0.1)
EOS%: 2.3 % (ref 0.0–7.0)
Eosinophils Absolute: 0.2 10*3/uL (ref 0.0–0.5)
HCT: 36.2 % (ref 34.8–46.6)
HGB: 11.9 g/dL (ref 11.6–15.9)
LYMPH%: 23.3 % (ref 14.0–49.7)
MCH: 29.1 pg (ref 25.1–34.0)
MCHC: 32.8 g/dL (ref 31.5–36.0)
MCV: 88.7 fL (ref 79.5–101.0)
MONO#: 0.7 10*3/uL (ref 0.1–0.9)
MONO%: 8.7 % (ref 0.0–14.0)
NEUT#: 4.9 10*3/uL (ref 1.5–6.5)
NEUT%: 65.1 % (ref 38.4–76.8)
PLATELETS: 258 10*3/uL (ref 145–400)
RBC: 4.08 10*6/uL (ref 3.70–5.45)
RDW: 19 % — ABNORMAL HIGH (ref 11.2–14.5)
WBC: 7.6 10*3/uL (ref 3.9–10.3)
lymph#: 1.8 10*3/uL (ref 0.9–3.3)

## 2013-09-17 NOTE — Telephone Encounter (Signed)
gv and printed appt sched and avs for pt for April...Marland KitchenMarland KitchenMarland Kitchenpt wanted 1pm appt....gv her 1st available...Marland KitchenMarland Kitchen

## 2013-09-17 NOTE — Telephone Encounter (Signed)
Call placed to pt regarding K+ level. Patient states that she has not been taking KDur 3 times a day as prescribed. I have instructed her to restart KDur 3 times per day and she states that she will do this.

## 2013-09-17 NOTE — Progress Notes (Signed)
[place]  [phone] [phone]) [phone]   OFFICE PROGRESS NOTE   Cc:  Brianna Holden  DIAGNOSIS:  80 year old with IgG kappa multiple myeloma with presenting abnormality of elevated total protein without anemia, hypercalcemia, or renal insufficiency. Her workup showed M-spike of 1.96 g/dL. Her bone marrow biopsy on 03/17/2009 showed 24% plasma cell. Cytogenetics were normal. FISH myeloma showed trisomy 11. Skeletal survey showed diffuse osteopenia.   PAST THERAPY: started on Revlimide day 1-21 /Dexamethasone weekly q4 weeks in February 2011 from which she achieved complete response her SPEP/serum free light chains.  She was on maintenance Revlimide 39m PO daily days 1-21 of every 28 days between June 2011 and June 2013 when it was discontinued after 2 years.   CURRENT THERAPY: Revlimid 15 mg daily days 1-21 of a 28 day cycle started in November 2014.   INTERVAL HISTORY: Patient returns for regular follow up visit. She is not reporting any new complaints since the last visit.  She denied fever, open wound, erythema of the knees.  She has mild and stable SOB and DOE which improve with inhalers.  She denied recurrent infection, abd pain, chest pain, bleeding symptoms, lower back pain, lower extremity weakness, bowel or bladder incontinence.She has not reported any recent illnesses or pathological fractures. She resumed Revlimid and had mild nausea and diarrhea. Nausea and diarrhea controlled with medications. Has not had any hospitalization or illnesses. He has not reported any new complications and continue to perform activities of daily living. Her mobility slightly limited with arthritis but no other hospitalizations.  Past Medical History  Diagnosis Date  . Myeloma   . COPD (chronic obstructive pulmonary disease)   . Osteopenia   . Hyperlipidemia   . Hypokalemia   . A-fib   . Depression     History reviewed. No pertinent past surgical  history.  Current Outpatient Prescriptions  Medication Sig Dispense Refill  . budesonide (PULMICORT) 180 MCG/ACT inhaler Inhale 1 puff into the lungs 2 (two) times daily as needed.        . celecoxib (CELEBREX) 100 MG capsule Take 100 mg by mouth daily. 1 to 2 caps as needed      . digoxin (LANOXIN) 0.25 MG tablet Take 250 mcg by mouth daily.        .Marland Kitchendiltiazem (CARDIZEM CD) 300 MG 24 hr capsule Take 300 mg by mouth daily.        .Marland Kitchendoxazosin (CARDURA) 2 MG tablet Take 2 mg by mouth at bedtime.      .Marland KitchenFLUoxetine (PROZAC) 40 MG capsule Take 40 mg by mouth daily.        . furosemide (LASIX) 20 MG tablet Take 20 mg by mouth as needed.      .Marland KitchenHYDROcodone-acetaminophen (NORCO/VICODIN) 5-325 MG per tablet Take 1 tablet by mouth every 6 (six) hours as needed for pain.      .Marland Kitchenlenalidomide (REVLIMID) 15 MG capsule Take 1 capsule (15 mg total) by mouth daily. Take 15 mg daily for 21 days.  21 capsule  0  . loperamide (IMODIUM) 2 MG capsule Take 2 mg by mouth 4 (four) times daily as needed.      .Marland Kitchenlosartan-hydrochlorothiazide (HYZAAR) 100-25 MG per tablet Take 1 tablet by mouth daily.        . niacin (NIASPAN) 500 MG CR tablet Take 500 mg by mouth at bedtime.        .Marland Kitchennystatin (MYCOSTATIN/NYSTOP) 100000 UNIT/GM POWD Apply to  rash twice a day.  1 Bottle  0  . omeprazole (PRILOSEC) 20 MG capsule Take 20 mg by mouth daily.        . potassium chloride SA (K-DUR,KLOR-CON) 20 MEQ tablet TAKE THREE TABLETS BY MOUTH DAILY AS DIRECTED  90 tablet  1  . pravastatin (PRAVACHOL) 40 MG tablet Take 40 mg by mouth daily.      . VENTOLIN HFA 108 (90 BASE) MCG/ACT inhaler Inhale 1 puff into the lungs daily.      [name] warfarin (COUMADIN) 4 MG tablet Take 4 mg by mouth daily.        . prochlorperazine (COMPAZINE) 10 MG tablet Take 1 tablet (10 mg total) by mouth every 6 (six) hours as needed for nausea or vomiting.  60 tablet  0   No current facility-administered medications for this visit.    ALLERGIES:  has No Known  Allergies.  REVIEW OF SYSTEMS:  The rest of the 14-point review of system was negative.   Filed Vitals:   09/17/13 1444  BP: 120/70  Pulse: 78  Temp: 97.8 F (36.6 C)  Resp: 18   Wt Readings from Last 3 Encounters:  09/17/13 173 lb 6.4 oz (78.654 kg)  08/18/13 175 lb 14.4 oz (79.788 kg)  07/23/13 175 lb (79.379 kg)   ECOG Performance status: 2  PHYSICAL EXAMINATION:   General: Mildly obese woman in no acute distress. Eyes: no scleral icterus. ENT: There were no oropharyngeal lesions. Neck was without thyromegaly. Lymphatics: Negative cervical, supraclavicular or axillary adenopathy. Respiratory: lungs were clear bilaterally without wheezing or crackles. Cardiovascular: Regular rate and rhythm, S1/S2, without murmur, rub or gallop. There was no pedal edema. GI: abdomen was soft, flat, nontender, nondistended, without organomegaly. Muscoloskeletal: no spinal tenderness of palpation of vertebral spine. There was no tenderness or increased in temperature on palpation or right knee.Skin exam was without echymosis, petichae. Neuro exam was nonfocal. Patient needed assistance to get on and off exam table. Gait was wide spaced with a cane. Patient was alerted and oriented. Not in any distress.    LABORATORY/RADIOLOGY DATA:  Lab Results  Component Value Date   WBC 7.6 09/17/2013   HGB 11.9 09/17/2013   HCT 36.2 09/17/2013   PLT 258 09/17/2013   GLUCOSE 107 08/18/2013   ALKPHOS 64 08/18/2013   ALT 15 08/18/2013   AST 21 08/18/2013   NA 138 08/18/2013   K 3.3* 08/18/2013   CL 102 10/16/2012   CREATININE 0.8 08/18/2013   BUN 14.3 08/18/2013   CO2 28 08/18/2013   INR 2.00 03/30/2009   Results for Brianna Holden, Brianna Holden (MRN [id number]) as of 09/17/2013 14:53  Ref. Range 08/18/2013 10:37  M-SPIKE, % No range found 1.60  SPE Interp. No range found *  IgG (Immunoglobin G), Serum Latest Range: [phone] mg/dL 2530 (H)  IgA Latest Range: 69-380 mg/dL 360  IgM, Serum Latest Range: 52-322 mg/dL 33 (L)  Total Protein, Serum  Electrophoresis Latest Range: 6.0-8.3 g/dL 8.4 (H)  Kappa free light chain Latest Range: 0.33-1.94 mg/dL 14.30 (H)  Lambda Free Lght Chn Latest Range: 0.57-2.63 mg/dL 2.41  Kappa:Lambda Ratio Latest Range: 0.26-1.65  5.93 (H)      ASSESSMENT AND PLAN:   1. History of multiple myeloma: She has been in remission with Revlimid maintenance until it was stopped in 12/2011 after two years.  M spike showed  increase but she continues to be asymptomatic. She has resumed Revlimid 15 mg daily for 21 days and 7 days off  beginning in Nov 2014. Her light chain studies slightly increased well will continue to monitor. She tolerated the medication without any major complication and we will consider dose reduction if she does have any issues in the future. 2. Hyperlipidemia: She is on Pravastatin per PCP. 3. History of atrial fib: She is on digoxin and Coumadin anticoagulation in addition to diltiazem rate control per PCP. 4. Depression: Controlled with fluoxetine per PCP. Her mood was pleasant today.  5. COPD: stable; asymptomatic; on inhalers prn per PCP. 6. DJD:  Right knee, pending eval by ortho for replacement.  The possibility of recurrent myeloma should not prevent her from having right knee replacement since the prognosis of myeloma can be years.  7. Hypokalemia. Potassium level returned after patient left our office. I have called her and left a voice message to confirm dose of KDur. Will increase dose once KDur dose confirmed. Recheck K+ level at next visit.  8. Follow-up: In 4-5 weeks   Bernell Haynie 09/17/2013

## 2013-09-30 ENCOUNTER — Other Ambulatory Visit: Payer: Self-pay | Admitting: *Deleted

## 2013-09-30 NOTE — Telephone Encounter (Signed)
THIS REFILL REQUEST FOR REVLIMID WAS PLACED IN DR.SHADAD'S ACTIVE WORK FOLDER. 

## 2013-10-01 ENCOUNTER — Other Ambulatory Visit: Payer: Self-pay | Admitting: Medical Oncology

## 2013-10-01 DIAGNOSIS — C9000 Multiple myeloma not having achieved remission: Secondary | ICD-10-CM

## 2013-10-01 MED ORDER — LENALIDOMIDE 15 MG PO CAPS: 15.0000 mg | ORAL_CAPSULE | Freq: Every day | ORAL | Status: DC

## 2013-10-01 NOTE — Telephone Encounter (Signed)
Fax sent to [facility] for prescription renewal of Revlimid 15 mg. Authorization # [id number]. Patient informed of renewal and that she needs to do patient survey. Patient states that she receives a paper survey in the mail.  Denies further questions at this time.

## 2013-10-01 NOTE — Telephone Encounter (Signed)
RECEIVED A FAX FROM [facility] CONCERNING A CONFIRMATION OF PT.'S PRESCRIPTION BENEFITS AND COPAY IS ZERO.

## 2013-10-26 ENCOUNTER — Other Ambulatory Visit: Payer: Self-pay | Admitting: *Deleted

## 2013-10-26 NOTE — Telephone Encounter (Signed)
THIS REFILL REQUEST FOR REVLIMID WAS GIVEN TO DR.SHADAD'S NURSE, DIXIE SMITH,RN. 

## 2013-10-28 ENCOUNTER — Other Ambulatory Visit: Payer: Self-pay | Admitting: *Deleted

## 2013-10-28 DIAGNOSIS — C9000 Multiple myeloma not having achieved remission: Secondary | ICD-10-CM

## 2013-10-28 MED ORDER — LENALIDOMIDE 15 MG PO CAPS: 15.0000 mg | ORAL_CAPSULE | Freq: Every day | ORAL | Status: DC

## 2013-10-28 NOTE — Telephone Encounter (Signed)
RECEIVED A FAX FROM [facility] CONCERNING A CONFIRMATION OF PT.'S PRESCRIPTION BENEFITS. PT.'S COPAY IS ZERO.

## 2013-11-01 NOTE — Telephone Encounter (Signed)
RECEIVED A FAX FROM [facility] CONCERNING A CONFIRMATION OF PRESCRIPTION SHIPMENT FOR REVLIMID ON 11/01/13.

## 2013-11-10 ENCOUNTER — Telehealth: Payer: Self-pay | Admitting: Oncology

## 2013-11-10 ENCOUNTER — Ambulatory Visit (HOSPITAL_BASED_OUTPATIENT_CLINIC_OR_DEPARTMENT_OTHER): Payer: Medicare Other | Admitting: Oncology

## 2013-11-10 ENCOUNTER — Other Ambulatory Visit (HOSPITAL_BASED_OUTPATIENT_CLINIC_OR_DEPARTMENT_OTHER): Payer: Medicare Other

## 2013-11-10 ENCOUNTER — Encounter: Payer: Self-pay | Admitting: Oncology

## 2013-11-10 VITALS — BP 134/72 | HR 85 | Temp 97.6°F | Resp 19 | Ht 61.0 in | Wt 168.3 lb

## 2013-11-10 DIAGNOSIS — E876 Hypokalemia: Secondary | ICD-10-CM

## 2013-11-10 DIAGNOSIS — C9000 Multiple myeloma not having achieved remission: Secondary | ICD-10-CM

## 2013-11-10 DIAGNOSIS — Z7901 Long term (current) use of anticoagulants: Secondary | ICD-10-CM

## 2013-11-10 DIAGNOSIS — I4891 Unspecified atrial fibrillation: Secondary | ICD-10-CM

## 2013-11-10 DIAGNOSIS — C9001 Multiple myeloma in remission: Secondary | ICD-10-CM

## 2013-11-10 DIAGNOSIS — E785 Hyperlipidemia, unspecified: Secondary | ICD-10-CM

## 2013-11-10 LAB — COMPREHENSIVE METABOLIC PANEL (CC13)
ALT: 14 U/L (ref 0–55)
ANION GAP: 8 meq/L (ref 3–11)
AST: 19 U/L (ref 5–34)
Albumin: 3 g/dL — ABNORMAL LOW (ref 3.5–5.0)
Alkaline Phosphatase: 76 U/L (ref 40–150)
BUN: 15.4 mg/dL (ref 7.0–26.0)
CALCIUM: 9.4 mg/dL (ref 8.4–10.4)
CHLORIDE: 100 meq/L (ref 98–109)
CO2: 25 meq/L (ref 22–29)
Creatinine: 0.8 mg/dL (ref 0.6–1.1)
Glucose: 85 mg/dl (ref 70–140)
Potassium: 3.7 mEq/L (ref 3.5–5.1)
SODIUM: 133 meq/L — AB (ref 136–145)
TOTAL PROTEIN: 9.5 g/dL — AB (ref 6.4–8.3)
Total Bilirubin: 0.69 mg/dL (ref 0.20–1.20)

## 2013-11-10 LAB — CBC WITH DIFFERENTIAL/PLATELET
BASO%: 0.4 % (ref 0.0–2.0)
Basophils Absolute: 0 10*3/uL (ref 0.0–0.1)
EOS ABS: 0.1 10*3/uL (ref 0.0–0.5)
EOS%: 1.1 % (ref 0.0–7.0)
HCT: 34.7 % — ABNORMAL LOW (ref 34.8–46.6)
HGB: 11.3 g/dL — ABNORMAL LOW (ref 11.6–15.9)
LYMPH%: 29.2 % (ref 14.0–49.7)
MCH: 28.9 pg (ref 25.1–34.0)
MCHC: 32.6 g/dL (ref 31.5–36.0)
MCV: 88.7 fL (ref 79.5–101.0)
MONO#: 0.6 10*3/uL (ref 0.1–0.9)
MONO%: 9.8 % (ref 0.0–14.0)
NEUT%: 59.5 % (ref 38.4–76.8)
NEUTROS ABS: 3.3 10*3/uL (ref 1.5–6.5)
Platelets: 245 10*3/uL (ref 145–400)
RBC: 3.91 10*6/uL (ref 3.70–5.45)
RDW: 17.3 % — ABNORMAL HIGH (ref 11.2–14.5)
WBC: 5.6 10*3/uL (ref 3.9–10.3)
lymph#: 1.6 10*3/uL (ref 0.9–3.3)

## 2013-11-10 NOTE — Progress Notes (Signed)
OFFICE PROGRESS NOTE    DIAGNOSIS:  80 year old with IgG kappa multiple myeloma with presenting abnormality of elevated total protein without anemia, hypercalcemia, or renal insufficiency. Her workup showed M-spike of 1.96 g/dL. Her bone marrow biopsy on 03/17/2009 showed 24% plasma cell. Cytogenetics were normal. FISH myeloma showed trisomy 11. Skeletal survey showed diffuse osteopenia.   PAST THERAPY: started on Revlimide day 1-21 /Dexamethasone weekly q4 weeks in February 2011 from which she achieved complete response her SPEP/serum free light chains.  She was on maintenance Revlimide 68m PO daily days 1-21 of every 28 days between June 2011 and June 2013 when it was discontinued after 2 years.   CURRENT THERAPY: Revlimid 15 mg daily days 1-21 of a 28 day cycle started in November 2014.   INTERVAL HISTORY: Patient returns for regular follow up visit. She is not reporting any new complaints since the last visit.  She has not reported any new complications from Revlimid. She has mild and stable SOB and DOE which improve with inhalers.  She denied recurrent infection, abd pain, chest pain, bleeding symptoms, lower back pain, lower extremity weakness, bowel or bladder incontinence.She has not reported any recent illnesses or pathological fractures. She has not had any hospitalization or illnesses. He has not reported any new complications and continue to perform activities of daily living. Her mobility slightly limited with arthritis but no other hospitalizations.  Past Medical History  Diagnosis Date  . Myeloma   . COPD (chronic obstructive pulmonary disease)   . Osteopenia   . Hyperlipidemia   . Hypokalemia   . A-fib   . Depression     No past surgical history on file.  Current Outpatient Prescriptions  Medication Sig Dispense Refill  . budesonide (PULMICORT) 180 MCG/ACT inhaler Inhale 1 puff into the lungs 2 (two) times daily as needed.        . celecoxib (CELEBREX) 100 MG capsule  Take 100 mg by mouth daily. 1 to 2 caps as needed      . digoxin (LANOXIN) 0.25 MG tablet Take 250 mcg by mouth daily.        .Marland Kitchendiltiazem (CARDIZEM CD) 300 MG 24 hr capsule Take 300 mg by mouth daily.        .Marland Kitchendoxazosin (CARDURA) 2 MG tablet Take 2 mg by mouth at bedtime.      .Marland KitchenFLUoxetine (PROZAC) 40 MG capsule Take 40 mg by mouth daily.        . furosemide (LASIX) 20 MG tablet Take 20 mg by mouth as needed.      .Marland KitchenHYDROcodone-acetaminophen (NORCO/VICODIN) 5-325 MG per tablet Take 1 tablet by mouth every 6 (six) hours as needed for pain.      .Marland Kitchenlenalidomide (REVLIMID) 15 MG capsule Take 1 capsule (15 mg total) by mouth daily. Take 15 mg daily for 21 days.  21 capsule  0  . loperamide (IMODIUM) 2 MG capsule Take 2 mg by mouth 4 (four) times daily as needed.      .Marland Kitchenlosartan-hydrochlorothiazide (HYZAAR) 100-25 MG per tablet Take 1 tablet by mouth daily.        . niacin (NIASPAN) 500 MG CR tablet Take 500 mg by mouth at bedtime.        .Marland Kitchennystatin (MYCOSTATIN/NYSTOP) 100000 UNIT/GM POWD Apply to rash twice a day.  1 Bottle  0  . omeprazole (PRILOSEC) 20 MG capsule Take 20 mg by mouth daily.        . potassium chloride  SA (K-DUR,KLOR-CON) 20 MEQ tablet TAKE THREE TABLETS BY MOUTH DAILY AS DIRECTED  90 tablet  1  . pravastatin (PRAVACHOL) 40 MG tablet Take 40 mg by mouth daily.      . prochlorperazine (COMPAZINE) 10 MG tablet Take 1 tablet (10 mg total) by mouth every 6 (six) hours as needed for nausea or vomiting.  60 tablet  0  . VENTOLIN HFA 108 (90 BASE) MCG/ACT inhaler Inhale 1 puff into the lungs daily.      [name] warfarin (COUMADIN) 4 MG tablet Take 4 mg by mouth daily.         No current facility-administered medications for this visit.    ALLERGIES:  has No Known Allergies.  REVIEW OF SYSTEMS:  The rest of the 14-point review of system was negative.   Filed Vitals:   11/10/13 1328  BP: 134/72  Pulse: 85  Temp: 97.6 F (36.4 C)  Resp: 19   Wt Readings from Last 3 Encounters:   11/10/13 168 lb 4.8 oz (76.34 kg)  09/17/13 173 lb 6.4 oz (78.654 kg)  08/18/13 175 lb 14.4 oz (79.788 kg)   ECOG Performance status: 1  PHYSICAL EXAMINATION:  Blood pressure 134/72, pulse 85, temperature 97.6 F (36.4 C), temperature source Oral, resp. rate 19, height 5' 1"  (1.549 m), weight 168 lb 4.8 oz (76.34 kg), SpO2 98.00%.  General: Mildly obese woman in no acute distress.  Eyes: no scleral icterus.  ENT: There were no oropharyngeal lesions. Neck was without thyromegaly. Lymphatics: Negative cervical, supraclavicular or axillary adenopathy. Respiratory: lungs were clear bilaterally without wheezing or crackles. No dullness to percussion.  Cardiovascular: Regular rate and rhythm, S1/S2, without murmur, rub or gallop. There was no pedal edema.  GI: abdomen was soft, flat, nontender, nondistended, without organomegaly. No ascites. Muscoloskeletal: no spinal tenderness of palpation of vertebral spine.  Skin exam was without echymosis, petichae. Neuro exam was nonfocal. Patient needed assistance to get on and off exam table. Gait was wide spaced with a cane. Patient was alerted and oriented.    LABORATORY/RADIOLOGY DATA:  Lab Results  Component Value Date   WBC 5.6 11/10/2013   HGB 11.3* 11/10/2013   HCT 34.7* 11/10/2013   PLT 245 11/10/2013   GLUCOSE 91 09/17/2013   ALKPHOS 65 09/17/2013   ALT 15 09/17/2013   AST 23 09/17/2013   NA 139 09/17/2013   K 2.9* 09/17/2013   CL 102 10/16/2012   CREATININE 0.8 09/17/2013   BUN 11.8 09/17/2013   CO2 29 09/17/2013   INR 2.00 03/30/2009    Results for CURLEY, FAYETTE (MRN [id number]) as of 11/10/2013 13:33  Ref. Range 02/03/2013 12:57 05/04/2013 12:48 07/23/2013 13:11 08/18/2013 10:37  IgG (Immunoglobin G), Serum Latest Range: [phone] mg/dL 1450 2040 (H) 2180 (H) 2530 (H)   Results for EULANDA, DORION (MRN [id number]) as of 11/10/2013 13:33  Ref. Range 02/03/2013 12:57 05/04/2013 12:48 07/23/2013 13:11 08/18/2013 10:37  M-SPIKE, % No range found 0.81 1.30 1.27  1.60     ASSESSMENT AND PLAN:   1. History of multiple myeloma: She has been in remission with Revlimid maintenance until it was stopped in 12/2011 after two years.  M spike showed  increase but she continues to be asymptomatic. She has resumed Revlimid 15 mg daily for 21 days and 7 days off beginning in Nov 2014. Her protein studies are showing mild increase but she continues to be asymptomatic. I discussed with her that most likely will have to change her medication either  to the full dose of Revlimid with dexamethasone or switch to a different agent. For the time being, she prefers to stay on the same dose and I think is reasonable given the fact that she has no clear-cut end organ damage. But I have educated her about the fact that this can change likely in the future and she will need a different therapy.  2. Hyperlipidemia: She is on Pravastatin per PCP. 3. History of atrial fib: She is on digoxin and Coumadin anticoagulation in addition to diltiazem rate control per PCP. 4. Depression: Controlled with fluoxetine per PCP. Her mood was pleasant today.  5. COPD: stable; asymptomatic; on inhalers prn per PCP. 6. DJD:  Appears to be stable at this time. 7. Hypokalemia. Potassium levels were measured again today and will communicate the findings to her as well. She continued to be on potassium supplements. 8. Follow-up: In 4-5 weeks   Wyatt Portela 11/10/2013

## 2013-11-10 NOTE — Telephone Encounter (Signed)
gave pt appt for lab and MD for 12/16/13

## 2013-11-11 LAB — KAPPA/LAMBDA LIGHT CHAINS
KAPPA FREE LGHT CHN: 13.1 mg/dL — AB (ref 0.33–1.94)
KAPPA LAMBDA RATIO: 8.73 — AB (ref 0.26–1.65)
LAMBDA FREE LGHT CHN: 1.5 mg/dL (ref 0.57–2.63)

## 2013-11-12 LAB — PROTEIN ELECTROPHORESIS, SERUM
ALBUMIN ELP: 38.6 % — AB (ref 55.8–66.1)
Alpha-1-Globulin: 4.9 % (ref 2.9–4.9)
Alpha-2-Globulin: 11.8 % (ref 7.1–11.8)
BETA GLOBULIN: 6.3 % (ref 4.7–7.2)
Beta 2: 4.4 % (ref 3.2–6.5)
Gamma Globulin: 34 % — ABNORMAL HIGH (ref 11.1–18.8)
M-Spike, %: 2.58 g/dL
TOTAL PROTEIN, SERUM ELECTROPHOR: 8.8 g/dL — AB (ref 6.0–8.3)

## 2013-11-23 ENCOUNTER — Other Ambulatory Visit: Payer: Self-pay | Admitting: *Deleted

## 2013-11-23 NOTE — Telephone Encounter (Signed)
Faxed refill request to [facility], [phone] for revlimid, with auth # [id number]

## 2013-11-24 ENCOUNTER — Other Ambulatory Visit: Payer: Self-pay | Admitting: *Deleted

## 2013-11-24 DIAGNOSIS — C9000 Multiple myeloma not having achieved remission: Secondary | ICD-10-CM

## 2013-11-24 MED ORDER — LENALIDOMIDE 15 MG PO CAPS: 15.0000 mg | ORAL_CAPSULE | Freq: Every day | ORAL | Status: DC

## 2013-11-25 NOTE — Telephone Encounter (Signed)
RECEIVED A FAX FROM [facility] CONCERNING A CONFIRMATION OF PATIENT'S PRESCRIPTION BENEFITS. PT.'S COPAY AMOUNT IS ZERO.

## 2013-11-26 NOTE — Telephone Encounter (Signed)
RECEIVED A FAX FROM [facility] CONCERNING A CONFIRMATION OF PRESCRIPTION SHIPMENT FOR REVLIMID ON 11/29/13.

## 2013-12-09 ENCOUNTER — Telehealth: Payer: Self-pay | Admitting: Medical Oncology

## 2013-12-09 NOTE — Telephone Encounter (Signed)
Patient called stating she is having shoulder surgery on June 4th d/t breakage and will not be able to make appt with Dr. Alen Blew (and Labs) sched for same day. Informed pt Dr. Alen Blew out of office and I will inbox him regarding pt's mssg and when to have her r/s for Dr. Alen Blew.  Patient expressed thanks, no further questions at this time.   Lab/Dr. Alen Blew appt cancelled for 06/04.

## 2013-12-09 NOTE — Telephone Encounter (Signed)
Please send a pof to the schedulers. First open with me or Mid level

## 2013-12-16 ENCOUNTER — Inpatient Hospital Stay (HOSPITAL_COMMUNITY): Admission: RE | Admit: 2013-12-16 | Payer: Medicare Other | Source: Ambulatory Visit | Admitting: Orthopedic Surgery

## 2013-12-16 ENCOUNTER — Other Ambulatory Visit: Payer: Medicare Other

## 2013-12-16 ENCOUNTER — Ambulatory Visit: Payer: Medicare Other | Admitting: Oncology

## 2013-12-16 ENCOUNTER — Encounter (HOSPITAL_COMMUNITY): Admission: RE | Payer: Self-pay | Source: Ambulatory Visit

## 2013-12-16 SURGERY — ARTHROPLASTY, SHOULDER, TOTAL, REVERSE
Anesthesia: Choice | Site: Shoulder | Laterality: Right

## 2013-12-20 ENCOUNTER — Telehealth: Payer: Self-pay | Admitting: *Deleted

## 2013-12-20 NOTE — Telephone Encounter (Signed)
Patient calling to say she cancelled her appt for labs and [name] on 12/16/13, d/t a fractured right shoulder from a fall, and was scheduled for surgery. However, her hemoglobin was too low and surgery was cancelled.   She is on revlimid, takes her last pill on 12/22/13. Should re-start on 12/28/13. Her pre-op lab appt is for 12/29/13. She would like to re-schedule missed appt with [name].

## 2013-12-21 ENCOUNTER — Other Ambulatory Visit: Payer: Self-pay | Admitting: *Deleted

## 2013-12-21 DIAGNOSIS — C9000 Multiple myeloma not having achieved remission: Secondary | ICD-10-CM

## 2013-12-21 MED ORDER — LENALIDOMIDE 15 MG PO CAPS: 15.0000 mg | ORAL_CAPSULE | Freq: Every day | ORAL | Status: DC

## 2013-12-21 NOTE — Telephone Encounter (Signed)
THIS REFILL REQUEST FOR REVLIMID WAS PLACED IN DR.SHADAD'S ACTIVE WORK FOLDER. 

## 2013-12-22 ENCOUNTER — Other Ambulatory Visit: Payer: Self-pay | Admitting: *Deleted

## 2013-12-22 NOTE — Telephone Encounter (Signed)
[facility] sent facsimile confirmation of Revlimid prescription shipment.  Revlimid was shipped for delivery on 12-23-2013.

## 2013-12-22 NOTE — Progress Notes (Signed)
pof to schedulers for f/u appt with dr Acquanetta Chain, after 01/07/14. Patient having surgery on the 25th. Patient aware that scheduling will be contacting her.

## 2013-12-25 ENCOUNTER — Telehealth: Payer: Self-pay | Admitting: Oncology

## 2013-12-27 ENCOUNTER — Encounter (HOSPITAL_COMMUNITY): Payer: Self-pay | Admitting: Pharmacy Technician

## 2013-12-28 NOTE — Pre-Procedure Instructions (Signed)
Brianna Holden  12/28/2013   Your procedure is scheduled on:  Thursday, June 25th  Report to [facility] Admitting at 0530 AM.  Call this number if you have problems the morning of surgery: [phone]   Remember:   Do not eat food or drink liquids after midnight.   Take these medicines the morning of surgery with A SIP OF WATER: digoxin, cardizem, prozac, prilosec, percocet if needed, albuterol if needed   Do not wear jewelry, make-up or nail polish.  Do not wear lotions, powders, or perfumes. You may wear deodorant.  Do not shave 48 hours prior to surgery. Men may shave face and neck.  Do not bring valuables to the hospital.  [facility] is not responsible  for any belongings or valuables.               Contacts, dentures or bridgework may not be worn into surgery.  Leave suitcase in the car. After surgery it may be brought to your room.  For patients admitted to the hospital, discharge time is determined by your  treatment team.               Patients discharged the day of surgery will not be allowed to drive home.  Please read over the following fact sheets that you were given: Pain Booklet, Coughing and Deep Breathing, Blood Transfusion Information and Surgical Site Infection Prevention [place] - Preparing for Surgery  Before surgery, you can play an important role.  Because skin is not sterile, your skin needs to be as free of germs as possible.  You can reduce the number of germs on you skin by washing with CHG (chlorahexidine gluconate) soap before surgery.  CHG is an antiseptic cleaner which kills germs and bonds with the skin to continue killing germs even after washing.  Please DO NOT use if you have an allergy to CHG or antibacterial soaps.  If your skin becomes reddened/irritated stop using the CHG and inform your nurse when you arrive at Short Stay.  Do not shave (including legs and underarms) for at least 48 hours prior to the first CHG shower.  You may  shave your face.  Please follow these instructions carefully:   1.  Shower with CHG Soap the night before surgery and the morning of Surgery.  2.  If you choose to wash your hair, wash your hair first as usual with your normal shampoo.  3.  After you shampoo, rinse your hair and body thoroughly to remove the shampoo.  4.  Use CHG as you would any other liquid soap.  You can apply CHG directly to the skin and wash gently with scrungie or a clean washcloth.  5.  Apply the CHG Soap to your body ONLY FROM THE NECK DOWN.  Do not use on open wounds or open sores.  Avoid contact with your eyes, ears, mouth and genitals (private parts).  Wash genitals (private parts) with your normal soap.  6.  Wash thoroughly, paying special attention to the area where your surgery will be performed.  7.  Thoroughly rinse your body with warm water from the neck down.  8.  DO NOT shower/wash with your normal soap after using and rinsing off the CHG Soap.  9.  Pat yourself dry with a clean towel.            10.  Wear clean pajamas.            11.  Place clean sheets on your bed the night of your first shower and do not sleep with pets.  Day of Surgery  Do not apply any lotions/deoderants the morning of surgery.  Please wear clean clothes to the hospital/surgery center.

## 2013-12-29 ENCOUNTER — Encounter (HOSPITAL_COMMUNITY): Payer: Self-pay

## 2013-12-29 ENCOUNTER — Encounter (HOSPITAL_COMMUNITY)
Admission: RE | Admit: 2013-12-29 | Discharge: 2013-12-29 | Disposition: A | Discharge status: Home / Self Care | Payer: Medicare Other | Source: Ambulatory Visit | Attending: Orthopedic Surgery | Admitting: Orthopedic Surgery

## 2013-12-29 DIAGNOSIS — Z01812 Encounter for preprocedural laboratory examination: Secondary | ICD-10-CM | POA: Insufficient documentation

## 2013-12-29 DIAGNOSIS — Z01818 Encounter for other preprocedural examination: Secondary | ICD-10-CM | POA: Insufficient documentation

## 2013-12-29 DIAGNOSIS — X58XXXA Exposure to other specified factors, initial encounter: Secondary | ICD-10-CM | POA: Insufficient documentation

## 2013-12-29 DIAGNOSIS — S42309A Unspecified fracture of shaft of humerus, unspecified arm, initial encounter for closed fracture: Secondary | ICD-10-CM | POA: Insufficient documentation

## 2013-12-29 HISTORY — DX: Frequency of micturition: R35.0

## 2013-12-29 HISTORY — DX: Gastro-esophageal reflux disease without esophagitis: K21.9

## 2013-12-29 HISTORY — DX: Unspecified osteoarthritis, unspecified site: M19.90

## 2013-12-29 HISTORY — DX: Anxiety disorder, unspecified: F41.9

## 2013-12-29 LAB — COMPREHENSIVE METABOLIC PANEL
ALK PHOS: 83 U/L (ref 39–117)
ALT: 10 U/L (ref 0–35)
AST: 19 U/L (ref 0–37)
Albumin: 2.6 g/dL — ABNORMAL LOW (ref 3.5–5.2)
BUN: 8 mg/dL (ref 6–23)
CO2: 25 meq/L (ref 19–32)
CREATININE: 0.51 mg/dL (ref 0.50–1.10)
Calcium: 8.6 mg/dL (ref 8.4–10.5)
Chloride: 95 mEq/L — ABNORMAL LOW (ref 96–112)
GFR calc Af Amer: >90 mL/min (ref >90)
GFR, EST NON AFRICAN AMERICAN: 88 mL/min — AB (ref >90)
GLUCOSE: 103 mg/dL — AB (ref 70–99)
POTASSIUM: 2.8 meq/L — AB (ref 3.7–5.3)
Sodium: 132 mEq/L — ABNORMAL LOW (ref 137–147)
Total Bilirubin: 1 mg/dL (ref 0.3–1.2)
Total Protein: 10.1 g/dL — ABNORMAL HIGH (ref 6.0–8.3)

## 2013-12-29 LAB — CBC WITH DIFFERENTIAL/PLATELET
Basophils Absolute: 0 10*3/uL (ref 0.0–0.1)
Basophils Relative: 0 % (ref 0–1)
Eosinophils Absolute: 0.1 10*3/uL (ref 0.0–0.7)
Eosinophils Relative: 2 % (ref 0–5)
HCT: 24.4 % — ABNORMAL LOW (ref 36.0–46.0)
Hemoglobin: 7.5 g/dL — ABNORMAL LOW (ref 12.0–15.0)
LYMPHS ABS: 1 10*3/uL (ref 0.7–4.0)
Lymphocytes Relative: 23 % (ref 12–46)
MCH: 28.8 pg (ref 26.0–34.0)
MCHC: 30.7 g/dL (ref 30.0–36.0)
MCV: 93.8 fL (ref 78.0–100.0)
MONO ABS: 0.9 10*3/uL (ref 0.1–1.0)
Monocytes Relative: 21 % — ABNORMAL HIGH (ref 3–12)
NEUTROS ABS: 2.5 10*3/uL (ref 1.7–7.7)
NEUTROS PCT: 54 % (ref 43–77)
Platelets: 202 10*3/uL (ref 150–400)
RBC: 2.6 MIL/uL — AB (ref 3.87–5.11)
RDW: 19.8 % — ABNORMAL HIGH (ref 11.5–15.5)
WBC: 4.5 10*3/uL (ref 4.0–10.5)

## 2013-12-29 LAB — APTT: APTT: 31 s (ref 24–37)

## 2013-12-29 LAB — PROTIME-INR
INR: 1.26 (ref 0.00–1.49)
Prothrombin Time: 15.5 seconds — ABNORMAL HIGH (ref 11.6–15.2)

## 2013-12-29 LAB — ABO/RH: ABO/RH(D): B POS

## 2013-12-29 NOTE — Progress Notes (Signed)
Message left with Pollyann Savoy @ Dr. Susie Cassette office that pt.'s Hgb. Was 7.5.

## 2013-12-30 ENCOUNTER — Other Ambulatory Visit: Payer: Self-pay | Admitting: Oncology

## 2014-01-04 ENCOUNTER — Other Ambulatory Visit: Payer: Self-pay | Admitting: Physician Assistant

## 2014-01-05 MED ORDER — CEFAZOLIN SODIUM-DEXTROSE 2-3 GM-% IV SOLR
2.0000 g | INTRAVENOUS | Status: AC
  Administered 2014-01-06: 2 g via INTRAVENOUS
  Filled 2014-01-05: qty 50

## 2014-01-06 ENCOUNTER — Encounter (HOSPITAL_COMMUNITY): Payer: Medicare Other | Admitting: Anesthesiology

## 2014-01-06 ENCOUNTER — Inpatient Hospital Stay (HOSPITAL_COMMUNITY): Payer: Medicare Other | Admitting: Anesthesiology

## 2014-01-06 ENCOUNTER — Inpatient Hospital Stay (HOSPITAL_COMMUNITY)
Admission: RE | Admit: 2014-01-06 | Discharge: 2014-01-08 | DRG: 483 | Disposition: A | Discharge status: Home Health | Payer: Medicare Other | Source: Ambulatory Visit | Attending: Orthopedic Surgery | Admitting: Orthopedic Surgery

## 2014-01-06 ENCOUNTER — Inpatient Hospital Stay (HOSPITAL_COMMUNITY): Payer: Medicare Other

## 2014-01-06 ENCOUNTER — Encounter (HOSPITAL_COMMUNITY)
Admission: RE | Disposition: A | Discharge status: Home Health | Payer: Self-pay | Source: Ambulatory Visit | Attending: Orthopedic Surgery

## 2014-01-06 ENCOUNTER — Encounter (HOSPITAL_COMMUNITY): Payer: Self-pay | Admitting: *Deleted

## 2014-01-06 DIAGNOSIS — D638 Anemia in other chronic diseases classified elsewhere: Secondary | ICD-10-CM | POA: Diagnosis present

## 2014-01-06 DIAGNOSIS — I9589 Other hypotension: Secondary | ICD-10-CM | POA: Diagnosis not present

## 2014-01-06 DIAGNOSIS — M19019 Primary osteoarthritis, unspecified shoulder: Secondary | ICD-10-CM | POA: Diagnosis present

## 2014-01-06 DIAGNOSIS — R0902 Hypoxemia: Secondary | ICD-10-CM | POA: Diagnosis not present

## 2014-01-06 DIAGNOSIS — J410 Simple chronic bronchitis: Secondary | ICD-10-CM

## 2014-01-06 DIAGNOSIS — F329 Major depressive disorder, single episode, unspecified: Secondary | ICD-10-CM | POA: Diagnosis present

## 2014-01-06 DIAGNOSIS — Z7982 Long term (current) use of aspirin: Secondary | ICD-10-CM

## 2014-01-06 DIAGNOSIS — Z79899 Other long term (current) drug therapy: Secondary | ICD-10-CM

## 2014-01-06 DIAGNOSIS — M949 Disorder of cartilage, unspecified: Secondary | ICD-10-CM

## 2014-01-06 DIAGNOSIS — G969 Disorder of central nervous system, unspecified: Secondary | ICD-10-CM

## 2014-01-06 DIAGNOSIS — E876 Hypokalemia: Secondary | ICD-10-CM | POA: Diagnosis present

## 2014-01-06 DIAGNOSIS — C9000 Multiple myeloma not having achieved remission: Secondary | ICD-10-CM | POA: Diagnosis present

## 2014-01-06 DIAGNOSIS — M899 Disorder of bone, unspecified: Secondary | ICD-10-CM | POA: Diagnosis present

## 2014-01-06 DIAGNOSIS — J438 Other emphysema: Secondary | ICD-10-CM

## 2014-01-06 DIAGNOSIS — K219 Gastro-esophageal reflux disease without esophagitis: Secondary | ICD-10-CM | POA: Diagnosis present

## 2014-01-06 DIAGNOSIS — I10 Essential (primary) hypertension: Secondary | ICD-10-CM | POA: Diagnosis present

## 2014-01-06 DIAGNOSIS — E785 Hyperlipidemia, unspecified: Secondary | ICD-10-CM | POA: Diagnosis present

## 2014-01-06 DIAGNOSIS — I480 Paroxysmal atrial fibrillation: Secondary | ICD-10-CM

## 2014-01-06 DIAGNOSIS — S42209A Unspecified fracture of upper end of unspecified humerus, initial encounter for closed fracture: Principal | ICD-10-CM | POA: Diagnosis present

## 2014-01-06 DIAGNOSIS — E871 Hypo-osmolality and hyponatremia: Secondary | ICD-10-CM | POA: Diagnosis present

## 2014-01-06 DIAGNOSIS — J449 Chronic obstructive pulmonary disease, unspecified: Secondary | ICD-10-CM | POA: Diagnosis present

## 2014-01-06 DIAGNOSIS — I4891 Unspecified atrial fibrillation: Secondary | ICD-10-CM | POA: Diagnosis present

## 2014-01-06 DIAGNOSIS — I959 Hypotension, unspecified: Secondary | ICD-10-CM

## 2014-01-06 DIAGNOSIS — Z96619 Presence of unspecified artificial shoulder joint: Secondary | ICD-10-CM

## 2014-01-06 DIAGNOSIS — F3289 Other specified depressive episodes: Secondary | ICD-10-CM | POA: Diagnosis present

## 2014-01-06 DIAGNOSIS — Z96611 Presence of right artificial shoulder joint: Secondary | ICD-10-CM

## 2014-01-06 DIAGNOSIS — F411 Generalized anxiety disorder: Secondary | ICD-10-CM | POA: Diagnosis present

## 2014-01-06 DIAGNOSIS — X58XXXA Exposure to other specified factors, initial encounter: Secondary | ICD-10-CM | POA: Diagnosis present

## 2014-01-06 DIAGNOSIS — J4489 Other specified chronic obstructive pulmonary disease: Secondary | ICD-10-CM | POA: Diagnosis present

## 2014-01-06 HISTORY — PX: REVERSE SHOULDER ARTHROPLASTY: SHX5054

## 2014-01-06 LAB — CBC
HCT: 25.7 % — ABNORMAL LOW (ref 36.0–46.0)
HCT: 27.1 % — ABNORMAL LOW (ref 36.0–46.0)
HEMOGLOBIN: 8.5 g/dL — AB (ref 12.0–15.0)
Hemoglobin: 7.9 g/dL — ABNORMAL LOW (ref 12.0–15.0)
MCH: 28.6 pg (ref 26.0–34.0)
MCH: 28.5 pg (ref 26.0–34.0)
MCHC: 30.7 g/dL (ref 30.0–36.0)
MCHC: 31.4 g/dL (ref 30.0–36.0)
MCV: 93.1 fL (ref 78.0–100.0)
MCV: 90.9 fL (ref 78.0–100.0)
Platelets: 234 10*3/uL (ref 150–400)
Platelets: 192 10*3/uL (ref 150–400)
RBC: 2.76 MIL/uL — AB (ref 3.87–5.11)
RBC: 2.98 MIL/uL — ABNORMAL LOW (ref 3.87–5.11)
RDW: 19.4 % — AB (ref 11.5–15.5)
RDW: 18.1 % — ABNORMAL HIGH (ref 11.5–15.5)
WBC: 5 10*3/uL (ref 4.0–10.5)
WBC: 6.1 10*3/uL (ref 4.0–10.5)

## 2014-01-06 LAB — BASIC METABOLIC PANEL
BUN: 8 mg/dL (ref 6–23)
CALCIUM: 8.8 mg/dL (ref 8.4–10.5)
CO2: 23 meq/L (ref 19–32)
Chloride: 96 mEq/L (ref 96–112)
Creatinine, Ser: 0.57 mg/dL (ref 0.50–1.10)
GFR calc Af Amer: >90 mL/min (ref >90)
GFR calc non Af Amer: 85 mL/min — ABNORMAL LOW (ref >90)
Glucose, Bld: 100 mg/dL — ABNORMAL HIGH (ref 70–99)
Potassium: 4 mEq/L (ref 3.7–5.3)
SODIUM: 131 meq/L — AB (ref 137–147)

## 2014-01-06 LAB — PREPARE RBC (CROSSMATCH)

## 2014-01-06 LAB — HEPARIN LEVEL (UNFRACTIONATED): HEPARIN UNFRACTIONATED: 0.2 [IU]/mL — AB (ref 0.30–0.70)

## 2014-01-06 SURGERY — ARTHROPLASTY, SHOULDER, TOTAL, REVERSE
Anesthesia: General | Site: Shoulder | Laterality: Right

## 2014-01-06 MED ORDER — CHLORHEXIDINE GLUCONATE 4 % EX LIQD
60.0000 mL | Freq: Once | CUTANEOUS | Status: DC
  Filled 2014-01-06: qty 60

## 2014-01-06 MED ORDER — DIPHENHYDRAMINE HCL 12.5 MG/5ML PO ELIX
12.5000 mg | ORAL_SOLUTION | ORAL | Status: DC | PRN
  Filled 2014-01-06: qty 10

## 2014-01-06 MED ORDER — METOPROLOL TARTRATE 1 MG/ML IV SOLN: 5.0000 mg | INTRAVENOUS | Status: DC | PRN

## 2014-01-06 MED ORDER — ACETAMINOPHEN 325 MG PO TABS: 650.0000 mg | ORAL_TABLET | Freq: Four times a day (QID) | ORAL | Status: DC | PRN

## 2014-01-06 MED ORDER — ALBUTEROL SULFATE (2.5 MG/3ML) 0.083% IN NEBU
2.5000 mg | INHALATION_SOLUTION | RESPIRATORY_TRACT | Status: DC | PRN
  Administered 2014-01-06: 2.5 mg via RESPIRATORY_TRACT
  Filled 2014-01-06: qty 3

## 2014-01-06 MED ORDER — FENTANYL CITRATE 0.05 MG/ML IJ SOLN
50.0000 ug | Freq: Once | INTRAMUSCULAR | Status: AC
  Administered 2014-01-06: 50 ug via INTRAVENOUS

## 2014-01-06 MED ORDER — DOXAZOSIN MESYLATE 2 MG PO TABS
2.0000 mg | ORAL_TABLET | Freq: Every day | ORAL | Status: DC
  Administered 2014-01-07: 2 mg via ORAL
  Filled 2014-01-06 (×2): qty 1

## 2014-01-06 MED ORDER — MIDAZOLAM HCL 2 MG/2ML IJ SOLN
INTRAMUSCULAR | Status: AC
  Administered 2014-01-06: 1 mg
  Filled 2014-01-06: qty 2

## 2014-01-06 MED ORDER — ONDANSETRON HCL 4 MG/2ML IJ SOLN
4.0000 mg | Freq: Once | INTRAMUSCULAR | Status: DC | PRN
Start: 2014-01-06 — End: 2014-01-06

## 2014-01-06 MED ORDER — METOCLOPRAMIDE HCL 5 MG PO TABS
5.0000 mg | ORAL_TABLET | Freq: Three times a day (TID) | ORAL | Status: DC | PRN
  Filled 2014-01-06: qty 2

## 2014-01-06 MED ORDER — LIDOCAINE HCL (CARDIAC) 20 MG/ML IV SOLN
INTRAVENOUS | Status: DC | PRN
  Administered 2014-01-06: 100 mg via INTRAVENOUS

## 2014-01-06 MED ORDER — BUPIVACAINE-EPINEPHRINE (PF) 0.5% -1:200000 IJ SOLN
INTRAMUSCULAR | Status: AC
  Filled 2014-01-06: qty 30

## 2014-01-06 MED ORDER — OXYCODONE-ACETAMINOPHEN 5-325 MG PO TABS
ORAL_TABLET | ORAL | Status: AC
  Administered 2014-01-06: 1 via ORAL
  Filled 2014-01-06: qty 1

## 2014-01-06 MED ORDER — LIDOCAINE HCL (CARDIAC) 20 MG/ML IV SOLN
INTRAVENOUS | Status: AC
  Filled 2014-01-06: qty 5

## 2014-01-06 MED ORDER — FUROSEMIDE 10 MG/ML IJ SOLN: 20.0000 mg | Freq: Once | INTRAMUSCULAR | Status: DC

## 2014-01-06 MED ORDER — FENTANYL CITRATE 0.05 MG/ML IJ SOLN
INTRAMUSCULAR | Status: AC
  Filled 2014-01-06: qty 5

## 2014-01-06 MED ORDER — SIMVASTATIN 20 MG PO TABS
20.0000 mg | ORAL_TABLET | Freq: Every day | ORAL | Status: DC
  Filled 2014-01-06: qty 1

## 2014-01-06 MED ORDER — DOXAZOSIN MESYLATE 2 MG PO TABS
2.0000 mg | ORAL_TABLET | Freq: Every day | ORAL | Status: DC
  Filled 2014-01-06: qty 1

## 2014-01-06 MED ORDER — FLEET ENEMA 7-19 GM/118ML RE ENEM: 1.0000 | ENEMA | Freq: Once | RECTAL | Status: AC | PRN

## 2014-01-06 MED ORDER — BUPIVACAINE-EPINEPHRINE (PF) 0.5% -1:200000 IJ SOLN
INTRAMUSCULAR | Status: DC | PRN
  Administered 2014-01-06: 30 mL via PERINEURAL

## 2014-01-06 MED ORDER — PROPOFOL 10 MG/ML IV BOLUS
INTRAVENOUS | Status: AC
  Filled 2014-01-06: qty 20

## 2014-01-06 MED ORDER — ASPIRIN EC 81 MG PO TBEC
81.0000 mg | DELAYED_RELEASE_TABLET | Freq: Every day | ORAL | Status: DC
  Administered 2014-01-07 – 2014-01-08 (×2): 81 mg via ORAL
  Filled 2014-01-06 (×2): qty 1

## 2014-01-06 MED ORDER — POTASSIUM CHLORIDE CRYS ER 20 MEQ PO TBCR
20.0000 meq | EXTENDED_RELEASE_TABLET | Freq: Two times a day (BID) | ORAL | Status: DC
  Administered 2014-01-06 – 2014-01-08 (×5): 20 meq via ORAL
  Filled 2014-01-06 (×6): qty 1

## 2014-01-06 MED ORDER — ONDANSETRON HCL 4 MG/2ML IJ SOLN
INTRAMUSCULAR | Status: AC
  Filled 2014-01-06: qty 2

## 2014-01-06 MED ORDER — MIDAZOLAM HCL 2 MG/2ML IJ SOLN
INTRAMUSCULAR | Status: AC
  Filled 2014-01-06: qty 2

## 2014-01-06 MED ORDER — OXYCODONE-ACETAMINOPHEN 5-325 MG PO TABS
1.0000 | ORAL_TABLET | ORAL | Status: DC | PRN
  Administered 2014-01-06: 1 via ORAL
  Administered 2014-01-06 – 2014-01-08 (×9): 2 via ORAL
  Filled 2014-01-06 (×9): qty 2

## 2014-01-06 MED ORDER — PHENYLEPHRINE 40 MCG/ML (10ML) SYRINGE FOR IV PUSH (FOR BLOOD PRESSURE SUPPORT)
PREFILLED_SYRINGE | INTRAVENOUS | Status: AC
  Filled 2014-01-06: qty 10

## 2014-01-06 MED ORDER — HYDROMORPHONE HCL PF 1 MG/ML IJ SOLN
0.2500 mg | INTRAMUSCULAR | Status: DC | PRN
  Administered 2014-01-06 – 2014-01-07 (×4): 1 mg via INTRAVENOUS
  Filled 2014-01-06 (×4): qty 1

## 2014-01-06 MED ORDER — PANTOPRAZOLE SODIUM 40 MG PO TBEC
40.0000 mg | DELAYED_RELEASE_TABLET | Freq: Every day | ORAL | Status: DC
  Administered 2014-01-06 – 2014-01-08 (×3): 40 mg via ORAL
  Filled 2014-01-06 (×2): qty 1

## 2014-01-06 MED ORDER — LACTATED RINGERS IV SOLN
INTRAVENOUS | Status: DC
  Administered 2014-01-06: 13:00:00 via INTRAVENOUS

## 2014-01-06 MED ORDER — NIACIN ER (ANTIHYPERLIPIDEMIC) 500 MG PO TBCR
1500.0000 mg | EXTENDED_RELEASE_TABLET | Freq: Every day | ORAL | Status: DC
  Administered 2014-01-06 – 2014-01-07 (×2): 1500 mg via ORAL
  Filled 2014-01-06 (×3): qty 3

## 2014-01-06 MED ORDER — PRAVASTATIN SODIUM 40 MG PO TABS
40.0000 mg | ORAL_TABLET | Freq: Every day | ORAL | Status: DC
  Administered 2014-01-06 – 2014-01-07 (×2): 40 mg via ORAL
  Filled 2014-01-06 (×3): qty 1

## 2014-01-06 MED ORDER — ACETAMINOPHEN 650 MG RE SUPP: 650.0000 mg | Freq: Four times a day (QID) | RECTAL | Status: DC | PRN

## 2014-01-06 MED ORDER — MEPERIDINE HCL 25 MG/ML IJ SOLN: 6.2500 mg | INTRAMUSCULAR | Status: DC | PRN

## 2014-01-06 MED ORDER — SODIUM CHLORIDE 0.9 % IV SOLN
INTRAVENOUS | Status: DC | PRN
  Administered 2014-01-06: 09:00:00 via INTRAVENOUS

## 2014-01-06 MED ORDER — BUPIVACAINE-EPINEPHRINE (PF) 0.25% -1:200000 IJ SOLN
INTRAMUSCULAR | Status: AC
  Filled 2014-01-06: qty 30

## 2014-01-06 MED ORDER — NEOSTIGMINE METHYLSULFATE 10 MG/10ML IV SOLN
INTRAVENOUS | Status: DC | PRN
  Administered 2014-01-06: 3 mg via INTRAVENOUS

## 2014-01-06 MED ORDER — ONDANSETRON HCL 4 MG PO TABS: 4.0000 mg | ORAL_TABLET | Freq: Four times a day (QID) | ORAL | Status: DC | PRN

## 2014-01-06 MED ORDER — POLYETHYLENE GLYCOL 3350 17 G PO PACK
17.0000 g | PACK | Freq: Every day | ORAL | Status: DC | PRN
  Filled 2014-01-06: qty 1

## 2014-01-06 MED ORDER — OXYCODONE HCL 5 MG PO TABS: 5.0000 mg | ORAL_TABLET | Freq: Once | ORAL | Status: DC | PRN

## 2014-01-06 MED ORDER — PROPOFOL 10 MG/ML IV BOLUS
INTRAVENOUS | Status: DC | PRN
  Administered 2014-01-06: 120 mg via INTRAVENOUS

## 2014-01-06 MED ORDER — LENALIDOMIDE 15 MG PO CAPS
15.0000 mg | ORAL_CAPSULE | ORAL | Status: DC
Start: 2014-01-06 — End: 2014-01-08
  Administered 2014-01-07: 15 mg via ORAL

## 2014-01-06 MED ORDER — MIDAZOLAM HCL 2 MG/2ML IJ SOLN: 1.0000 mg | Freq: Once | INTRAMUSCULAR | Status: DC

## 2014-01-06 MED ORDER — FENTANYL CITRATE 0.05 MG/ML IJ SOLN
INTRAMUSCULAR | Status: AC
  Filled 2014-01-06: qty 2

## 2014-01-06 MED ORDER — PHENOL 1.4 % MT LIQD: 1.0000 | OROMUCOSAL | Status: DC | PRN

## 2014-01-06 MED ORDER — FLUOXETINE HCL 20 MG PO CAPS
40.0000 mg | ORAL_CAPSULE | Freq: Every day | ORAL | Status: DC
  Administered 2014-01-07 – 2014-01-08 (×2): 40 mg via ORAL
  Filled 2014-01-06 (×2): qty 2

## 2014-01-06 MED ORDER — DILTIAZEM HCL ER COATED BEADS 300 MG PO CP24: 300.0000 mg | ORAL_CAPSULE | Freq: Every day | ORAL | Status: DC

## 2014-01-06 MED ORDER — CEFAZOLIN SODIUM 1-5 GM-% IV SOLN
1.0000 g | Freq: Four times a day (QID) | INTRAVENOUS | Status: AC
  Administered 2014-01-06 – 2014-01-07 (×3): 1 g via INTRAVENOUS
  Filled 2014-01-06 (×3): qty 50

## 2014-01-06 MED ORDER — DOCUSATE SODIUM 100 MG PO CAPS
100.0000 mg | ORAL_CAPSULE | Freq: Two times a day (BID) | ORAL | Status: DC
  Administered 2014-01-06 – 2014-01-08 (×4): 100 mg via ORAL
  Filled 2014-01-06 (×5): qty 1

## 2014-01-06 MED ORDER — FUROSEMIDE 20 MG PO TABS
20.0000 mg | ORAL_TABLET | Freq: Every day | ORAL | Status: DC
  Administered 2014-01-07 – 2014-01-08 (×2): 20 mg via ORAL
  Filled 2014-01-06 (×2): qty 1

## 2014-01-06 MED ORDER — ALBUTEROL SULFATE HFA 108 (90 BASE) MCG/ACT IN AERS: 2.0000 | INHALATION_SPRAY | Freq: Four times a day (QID) | RESPIRATORY_TRACT | Status: DC | PRN

## 2014-01-06 MED ORDER — WARFARIN SODIUM 4 MG PO TABS
4.0000 mg | ORAL_TABLET | Freq: Every day | ORAL | Status: DC
  Administered 2014-01-06 – 2014-01-07 (×2): 4 mg via ORAL
  Filled 2014-01-06 (×3): qty 1

## 2014-01-06 MED ORDER — DILTIAZEM HCL 60 MG PO TABS
60.0000 mg | ORAL_TABLET | Freq: Three times a day (TID) | ORAL | Status: DC
Start: 2014-01-06 — End: 2014-01-07
  Administered 2014-01-06 – 2014-01-07 (×3): 60 mg via ORAL
  Filled 2014-01-06 (×5): qty 1

## 2014-01-06 MED ORDER — ALUM & MAG HYDROXIDE-SIMETH 200-200-20 MG/5ML PO SUSP: 30.0000 mL | ORAL | Status: DC | PRN

## 2014-01-06 MED ORDER — FENTANYL CITRATE 0.05 MG/ML IJ SOLN
INTRAMUSCULAR | Status: DC | PRN
  Administered 2014-01-06: 50 ug via INTRAVENOUS
  Administered 2014-01-06: 150 ug via INTRAVENOUS

## 2014-01-06 MED ORDER — PHENYLEPHRINE HCL 10 MG/ML IJ SOLN
INTRAMUSCULAR | Status: DC | PRN
  Administered 2014-01-06: 200 ug via INTRAVENOUS
  Administered 2014-01-06: 120 ug via INTRAVENOUS
  Administered 2014-01-06: 80 ug via INTRAVENOUS
  Administered 2014-01-06: 120 ug via INTRAVENOUS
  Administered 2014-01-06: 80 ug via INTRAVENOUS

## 2014-01-06 MED ORDER — HYDROMORPHONE HCL PF 1 MG/ML IJ SOLN: 0.2500 mg | INTRAMUSCULAR | Status: DC | PRN

## 2014-01-06 MED ORDER — BISACODYL 5 MG PO TBEC: 5.0000 mg | DELAYED_RELEASE_TABLET | Freq: Every day | ORAL | Status: DC | PRN

## 2014-01-06 MED ORDER — SODIUM CHLORIDE 0.9 % IV SOLN
10.0000 mg | INTRAVENOUS | Status: DC | PRN
  Administered 2014-01-06: 50 ug/min via INTRAVENOUS

## 2014-01-06 MED ORDER — GLYCOPYRROLATE 0.2 MG/ML IJ SOLN
INTRAMUSCULAR | Status: DC | PRN
  Administered 2014-01-06: 0.4 mg via INTRAVENOUS

## 2014-01-06 MED ORDER — OXYCODONE HCL 5 MG/5ML PO SOLN: 5.0000 mg | Freq: Once | ORAL | Status: DC | PRN

## 2014-01-06 MED ORDER — DIGOXIN 250 MCG PO TABS
250.0000 ug | ORAL_TABLET | Freq: Every day | ORAL | Status: DC
  Administered 2014-01-07 – 2014-01-08 (×2): 250 ug via ORAL
  Filled 2014-01-06 (×2): qty 1

## 2014-01-06 MED ORDER — ROCURONIUM BROMIDE 100 MG/10ML IV SOLN
INTRAVENOUS | Status: DC | PRN
  Administered 2014-01-06: 30 mg via INTRAVENOUS
  Administered 2014-01-06: 5 mg via INTRAVENOUS

## 2014-01-06 MED ORDER — ONDANSETRON HCL 4 MG/2ML IJ SOLN: 4.0000 mg | Freq: Four times a day (QID) | INTRAMUSCULAR | Status: DC | PRN

## 2014-01-06 MED ORDER — ONDANSETRON HCL 4 MG/2ML IJ SOLN
INTRAMUSCULAR | Status: DC | PRN
  Administered 2014-01-06: 4 mg via INTRAVENOUS

## 2014-01-06 MED ORDER — WARFARIN - PHARMACIST DOSING INPATIENT: Freq: Every day | Status: DC

## 2014-01-06 MED ORDER — METOCLOPRAMIDE HCL 5 MG/ML IJ SOLN
5.0000 mg | Freq: Three times a day (TID) | INTRAMUSCULAR | Status: DC | PRN
  Filled 2014-01-06: qty 2

## 2014-01-06 MED ORDER — MENTHOL 3 MG MT LOZG
1.0000 | LOZENGE | OROMUCOSAL | Status: DC | PRN
  Filled 2014-01-06: qty 9

## 2014-01-06 MED ORDER — HEPARIN (PORCINE) IN NACL 100-0.45 UNIT/ML-% IJ SOLN
1400.0000 [IU]/h | INTRAMUSCULAR | Status: DC
  Administered 2014-01-06: 1150 [IU]/h via INTRAVENOUS
  Administered 2014-01-08: 1400 [IU]/h via INTRAVENOUS
  Filled 2014-01-06 (×6): qty 250

## 2014-01-06 MED ORDER — LOSARTAN POTASSIUM-HCTZ 100-25 MG PO TABS: 1.0000 | ORAL_TABLET | Freq: Every day | ORAL | Status: DC

## 2014-01-06 MED ORDER — LACTATED RINGERS IV SOLN
INTRAVENOUS | Status: DC
  Administered 2014-01-06: 07:00:00 via INTRAVENOUS

## 2014-01-06 SURGICAL SUPPLY — 71 items
BIT DRILL 170X2.5X (BIT) IMPLANT
BIT DRL 170X2.5X (BIT)
BLADE SAW SGTL 83.5X18.5 (BLADE) ×3 IMPLANT
BRUSH FEMORAL CANAL (MISCELLANEOUS) IMPLANT
CAPT SHOULD DELTAXTEND CEM MOD ×3 IMPLANT
CEMENT BONE DEPUY (Cement) ×6 IMPLANT
CEMENT RESTRICTOR DEPUY SZ 3 (Cement) ×3 IMPLANT
CLOSURE WOUND 1/2 X4 (GAUZE/BANDAGES/DRESSINGS) ×2
COVER SURGICAL LIGHT HANDLE (MISCELLANEOUS) ×3 IMPLANT
DRAPE INCISE IOBAN 66X45 STRL (DRAPES) ×3 IMPLANT
DRAPE SURG 17X11 SM STRL (DRAPES) ×3 IMPLANT
DRAPE U-SHAPE 47X51 STRL (DRAPES) ×3 IMPLANT
DRILL 2.5 (BIT)
DRILL BIT 7/64X5 (BIT) ×3 IMPLANT
DRSG AQUACEL AG ADV 3.5X10 (GAUZE/BANDAGES/DRESSINGS) ×3 IMPLANT
DRSG MEPILEX BORDER 4X8 (GAUZE/BANDAGES/DRESSINGS) IMPLANT
DURAPREP 26ML APPLICATOR (WOUND CARE) ×6 IMPLANT
ELECT BLADE 4.0 EZ CLEAN MEGAD (MISCELLANEOUS) ×3
ELECT CAUTERY BLADE 6.4 (BLADE) ×3 IMPLANT
ELECT REM PT RETURN 9FT ADLT (ELECTROSURGICAL) ×3
ELECTRODE BLDE 4.0 EZ CLN MEGD (MISCELLANEOUS) ×1 IMPLANT
ELECTRODE REM PT RTRN 9FT ADLT (ELECTROSURGICAL) ×1 IMPLANT
FACESHIELD WRAPAROUND (MASK) ×9 IMPLANT
GLOVE BIO SURGEON STRL SZ7.5 (GLOVE) ×3 IMPLANT
GLOVE BIO SURGEON STRL SZ8 (GLOVE) ×3 IMPLANT
GLOVE EUDERMIC 7 POWDERFREE (GLOVE) ×3 IMPLANT
GLOVE SS BIOGEL STRL SZ 7.5 (GLOVE) ×1 IMPLANT
GLOVE SUPERSENSE BIOGEL SZ 7.5 (GLOVE) ×2
GOWN STRL REUS W/ TWL LRG LVL3 (GOWN DISPOSABLE) ×1 IMPLANT
GOWN STRL REUS W/ TWL XL LVL3 (GOWN DISPOSABLE) ×2 IMPLANT
GOWN STRL REUS W/TWL LRG LVL3 (GOWN DISPOSABLE) ×2
GOWN STRL REUS W/TWL XL LVL3 (GOWN DISPOSABLE) ×4
HANDPIECE INTERPULSE COAX TIP (DISPOSABLE)
KIT BASIN OR (CUSTOM PROCEDURE TRAY) ×3 IMPLANT
KIT ROOM TURNOVER OR (KITS) ×3 IMPLANT
MANIFOLD NEPTUNE II (INSTRUMENTS) ×3 IMPLANT
NDL SUT 6 .5 CRC .975X.05 MAYO (NEEDLE) IMPLANT
NEEDLE HYPO 25GX1X1/2 BEV (NEEDLE) IMPLANT
NEEDLE MAYO TAPER (NEEDLE)
NS IRRIG 1000ML POUR BTL (IV SOLUTION) ×3 IMPLANT
PACK SHOULDER (CUSTOM PROCEDURE TRAY) ×3 IMPLANT
PAD ARMBOARD 7.5X6 YLW CONV (MISCELLANEOUS) ×6 IMPLANT
PASSER SUT SWANSON 36MM LOOP (INSTRUMENTS) ×3 IMPLANT
PIN GUIDE 1.2 (PIN) IMPLANT
PIN GUIDE GLENOPHERE 1.5MX300M (PIN) IMPLANT
PIN METAGLENE 2.5 (PIN) IMPLANT
PRESSURIZER FEMORAL UNIV (MISCELLANEOUS) IMPLANT
SET HNDPC FAN SPRY TIP SCT (DISPOSABLE) IMPLANT
SLING ARM LRG ADULT FOAM STRAP (SOFTGOODS) IMPLANT
SLING ARM XL FOAM STRAP (SOFTGOODS) IMPLANT
SPONGE LAP 18X18 X RAY DECT (DISPOSABLE) ×6 IMPLANT
SPONGE LAP 4X18 X RAY DECT (DISPOSABLE) ×3 IMPLANT
STRIP CLOSURE SKIN 1/2X4 (GAUZE/BANDAGES/DRESSINGS) ×4 IMPLANT
SUCTION FRAZIER TIP 10 FR DISP (SUCTIONS) ×3 IMPLANT
SUT BONE WAX W31G (SUTURE) IMPLANT
SUT FIBERWIRE #2 38 T-5 BLUE (SUTURE)
SUT MNCRL AB 3-0 PS2 18 (SUTURE) ×3 IMPLANT
SUT VIC AB 1 CT1 27 (SUTURE) ×2
SUT VIC AB 1 CT1 27XBRD ANBCTR (SUTURE) ×1 IMPLANT
SUT VIC AB 2-0 CT1 27 (SUTURE) ×2
SUT VIC AB 2-0 CT1 TAPERPNT 27 (SUTURE) ×1 IMPLANT
SUT VIC AB 2-0 SH 27 (SUTURE)
SUT VIC AB 2-0 SH 27X BRD (SUTURE) IMPLANT
SUTURE FIBERWR #2 38 T-5 BLUE (SUTURE) IMPLANT
SYR 30ML SLIP (SYRINGE) ×3 IMPLANT
SYR CONTROL 10ML LL (SYRINGE) IMPLANT
TOWEL OR 17X24 6PK STRL BLUE (TOWEL DISPOSABLE) ×6 IMPLANT
TOWEL OR 17X26 10 PK STRL BLUE (TOWEL DISPOSABLE) ×3 IMPLANT
TOWER CARTRIDGE SMART MIX (DISPOSABLE) ×3 IMPLANT
TRAY FOLEY CATH 16FRSI W/METER (SET/KITS/TRAYS/PACK) IMPLANT
WATER STERILE IRR 1000ML POUR (IV SOLUTION) ×3 IMPLANT

## 2014-01-06 NOTE — Op Note (Signed)
01/06/2014  9:34 AM  PATIENT:   Brianna Holden  80 y.o. female  PRE-OPERATIVE DIAGNOSIS:  RIGHT PROXIMAL HUMERUS FRACTURE WITH MALUNION/NONUNION  POST-OPERATIVE DIAGNOSIS:  SAME  PROCEDURE:  REVERSE ARTHROPLASTY RIGHT SHOULDER  SURGEON:  Supple, Metta Clines. M.D.  ASSISTANTS: Shuford pac   ANESTHESIA:   GET + ISB  EBL: 250  SPECIMEN:  none  Drains: hemovac x 1 2U PRBC's given intra-op   PATIENT DISPOSITION: extubated to RR with close monitoring of ventilatory status    PLAN OF CARE: Admit to inpatient   Dictation# [id number]

## 2014-01-06 NOTE — Anesthesia Postprocedure Evaluation (Signed)
Anesthesia Post Note  Patient: Brianna Holden  Procedure(s) Performed: Procedure(s) (LRB): REVERSE RIGHT  SHOULDER ARTHROPLASTY  (Right)  Anesthesia type: general  Patient location: PACU  Post pain: Pain level controlled  Post assessment: Patient's Cardiovascular Status Stable  Last Vitals:  Filed Vitals:   01/06/14 1317  BP: 107/57  Pulse: 82  Temp:   Resp: 17    Post vital signs: Reviewed and stable  Level of consciousness: sedated  Complications: No apparent anesthesia complications

## 2014-01-06 NOTE — Anesthesia Preprocedure Evaluation (Addendum)
Anesthesia Evaluation  Patient identified by MRN, date of birth, ID band Patient awake    Reviewed: Allergy & Precautions, H&P , NPO status , Patient's Chart, lab work & pertinent test results  History of Anesthesia Complications (+) PONV  Airway Mallampati: I TM Distance: >3 FB Neck [name]: Full    Dental  (+) Teeth Intact, Chipped,    Pulmonary shortness of breath and with exertion, COPD COPD inhaler,          Cardiovascular + dysrhythmias Atrial Fibrillation     Neuro/Psych    GI/Hepatic GERD-  Controlled and Medicated,  Endo/Other    Renal/GU      Musculoskeletal   Abdominal   Peds  Hematology   Anesthesia Other Findings   Reproductive/Obstetrics                          Anesthesia Physical Anesthesia Plan  ASA: III  Anesthesia Plan: General   Post-op Pain Management:    Induction: Intravenous  Airway Management Planned: Oral ETT  Additional Equipment:   Intra-op Plan:   Post-operative Plan: Extubation in OR  Informed Consent: I have reviewed the patients History and Physical, chart, labs and discussed the procedure including the risks, benefits and alternatives for the proposed anesthesia with the patient or authorized representative who has indicated his/her understanding and acceptance.   Dental advisory given  Plan Discussed with: CRNA and Surgeon  Anesthesia Plan Comments: (Will transfuse her for Hb 7.4. (11.4 in April))       Anesthesia Quick Evaluation

## 2014-01-06 NOTE — Progress Notes (Addendum)
ANTICOAGULATION CONSULT NOTE - Initial Consult  Pharmacy Consult for Coumadin / Heparin Indication: atrial fibrillation  No Known Allergies  Patient Measurements: Height: 5\' 1"  (154.9 cm) Weight: 169 lb (76.658 kg) IBW/kg (Calculated) : 47.8  Vital Signs: Temp: 98 F (36.7 C) (06/25 1215) Temp src: Oral (06/25 0620) BP: 107/57 mmHg (06/25 1317) Pulse Rate: 82 (06/25 1317)  Labs:  Recent Labs  01/06/14 0636 01/06/14 1130  HGB 7.9* 8.5*  HCT 25.7* 27.1*  PLT 234 192  CREATININE 0.57  --     Estimated Creatinine Clearance: 51.7 ml/min (by C-G formula based on Cr of 0.57).   Medical History: Past Medical History  Diagnosis Date  . Myeloma   . COPD (chronic obstructive pulmonary disease)   . Osteopenia   . Hyperlipidemia   . Hypokalemia   . A-fib   . Depression   . PONV (postoperative nausea and vomiting)   . Dysrhythmia   . Shortness of breath     with activity  . Anxiety   . Frequency of urination   . GERD (gastroesophageal reflux disease)   . Arthritis    Assessment:   S/p shoulder surgery. To resume home Coumadin regimen.  She reports being off Coumadin (4 mg daily) for several weeks, since prior to previously-scheduled surgery, which was postponed from 12/16/13 due to low hemoglobin.  She has been on ASA 81 mg daily. INR 1.26 on 12/29/13.  Goal of Therapy:  INR 2-3 Monitor platelets by anticoagulation protocol: Yes   Plan:   Resume Coumadin 4 mg daily.  Daily PT/INR and CBC while in the hospital.  Arty Baumgartner, [place] Pager: [phone] 01/06/2014,2:41 PM  Added heparin for bridge at 1150 units / hr  Thank you. Anette Guarneri, PharmD

## 2014-01-06 NOTE — Anesthesia Procedure Notes (Addendum)
Anesthesia Regional Block:  Interscalene brachial plexus block  Pre-Anesthetic Checklist: ,, timeout performed, Correct Patient, Correct Site, Correct Laterality, Correct Procedure, Correct Position, site marked, Risks and benefits discussed,  Surgical consent,  Pre-op evaluation,  At surgeon's request and post-op pain management  Laterality: Right  Prep: chloraprep       Needles:  Injection technique: Single-shot  Needle Type: Echogenic Stimulator Needle     Needle Length: 5cm 5 cm Needle Gauge: 22 and 22 G    Additional Needles:  Procedures: ultrasound guided (picture in chart) and nerve stimulator Interscalene brachial plexus block  Nerve Stimulator or Paresthesia:  Response: biceps flexion, 0.45 mA,   Additional Responses:   Narrative:  Start time: 01/06/2014 6:56 AM End time: 01/06/2014 7:13 AM Injection made incrementally with aspirations every 5 mL.  Performed by: Personally  Anesthesiologist: Dr Marcie Bal  Additional Notes: Functioning IV was confirmed and monitors were applied.  A 12mm 22ga Arrow echogenic stimulator needle was used. Sterile prep and drape,hand hygiene and sterile gloves were used.  Negative aspiration and negative test dose prior to incremental administration of local anesthetic. The patient tolerated the procedure well.  Ultrasound guidance: relevent anatomy identified, needle position confirmed, local anesthetic spread visualized around nerve(s), vascular puncture avoided.  Image printed for medical record.    Procedure Name: Intubation Date/Time: 01/06/2014 7:47 AM Performed by: Kyung Rudd Pre-anesthesia Checklist: Patient identified, Emergency Drugs available, Suction available, Patient being monitored and Timeout performed Patient Re-evaluated:Patient Re-evaluated prior to inductionOxygen Delivery Method: Circle system utilized Preoxygenation: Pre-oxygenation with 100% oxygen Intubation Type: IV induction Ventilation: Mask ventilation  without difficulty Laryngoscope Size: Mac and 3 Grade View: Grade I Tube type: Oral Tube size: 7.0 mm Number of attempts: 1 Airway Equipment and Method: Stylet Placement Confirmation: ETT inserted through vocal cords under direct vision,  positive ETCO2 and breath sounds checked- equal and bilateral Secured at: 21 cm Tube secured with: Tape Dental Injury: Teeth and Oropharynx as per pre-operative assessment

## 2014-01-06 NOTE — Progress Notes (Signed)
ANTICOAGULATION CONSULT NOTE - Follow Up Consult  Pharmacy Consult for Heparin  Indication: atrial fibrillation  No Known Allergies  Patient Measurements: Height: 5\' 1"  (154.9 cm) Weight: 169 lb (76.658 kg) IBW/kg (Calculated) : 47.8  Vital Signs: Temp: 97.5 F (36.4 C) (06/25 2054) Temp src: Oral (06/25 2054) BP: 105/66 mmHg (06/25 2247) Pulse Rate: 61 (06/25 2054)  Labs:  Recent Labs  01/06/14 0636 01/06/14 1130 01/06/14 2239  HGB 7.9* 8.5*  --   HCT 25.7* 27.1*  --   PLT 234 192  --   HEPARINUNFRC  --   --  0.20*  CREATININE 0.57  --   --    Estimated Creatinine Clearance: 51.7 ml/min (by C-G formula based on Cr of 0.57).  Medications:  Heparin 1150 units/hr  Assessment: 80 y/o F on heparin for afib. HL is 0.2. Other labs as above.   Goal of Therapy:  Heparin level 0.3-0.7 units/ml Monitor platelets by anticoagulation protocol: Yes   Plan:  -Increase heparin drip to 1300 units/hr -0800 HL -Daily CBC/HL -Monitor for bleeding  Narda Bonds 01/06/2014,11:51 PM

## 2014-01-06 NOTE — Consult Note (Signed)
Patient Demographics  Brianna Holden, is a 80 y.o. female   MRN: [id number]   DOB - 02-16-1931  Admit Date - 01/06/2014    Outpatient Primary MD for the patient is Cyndi Bender, PA-C  Consult requested in the Hospital by Marin Shutter, MD, On 01/06/2014    Reason for consult postop hypotension and hypoxia in PACU   With History of -  Past Medical History  Diagnosis Date  . Myeloma   . COPD (chronic obstructive pulmonary disease)   . Osteopenia   . Hyperlipidemia   . Hypokalemia   . A-fib   . Depression   . PONV (postoperative nausea and vomiting)   . Dysrhythmia   . Shortness of breath     with activity  . Anxiety   . Frequency of urination   . GERD (gastroesophageal reflux disease)   . Arthritis       Past Surgical History  Procedure Laterality Date  . Thyroidectomy, partial    . Tonsillectomy    . Appendectomy      in for   Elective R. shoulder surgery by orthopedics  HPI  Brianna Holden  is a 80 y.o. female, multiple myeloma with chronic anemia, COPD, dyslipidemia, GERD, atrial fibrillation was on Coumadin prior to surgery, who was admitted by orthopedics team for elective right shoulder surgery, Coumadin was held prior to surgery, she was taken to the OR and operated on 01/06/2014 under general anesthesia with endotracheal intubation along with right shoulder brachial plexus block. She also received 2 units of packed RBC during surgery, Postop in the PACU she was noted to be slightly hypoxic and hypotensive. And I was called to assist with management.   Patient is currently in PACU appears to be in no distress, still somnolent, blood pressures are improving systolic last was 751, pulse ox was 97% on 2 L nasal cannula, I think as anesthesia wears off she will improve.    Review of Systems    Brianna Holden still  somnolent in PACU however appears to be in no distress, unable to provide review of systems   Social History History  Substance Use Topics  . Smoking status: Never Smoker   . Smokeless tobacco: Not on file  . Alcohol Use: 1.8 oz/week    3 Cans of beer per week     Family History Unobtainable due to mentation  Prior to Admission medications   Medication Sig Start Date End Date Taking? Authorizing Niajah Sipos  albuterol (PROVENTIL HFA;VENTOLIN HFA) 108 (90 BASE) MCG/ACT inhaler Inhale 2 puffs into the lungs 4 (four) times daily as needed for wheezing or shortness of breath.   Yes Historical Dayjah Selman, MD  aspirin EC 81 MG tablet Take 81 mg by mouth daily.   Yes Historical Meila Berke, MD  digoxin (LANOXIN) 0.25 MG tablet Take 250 mcg by mouth daily.     Yes Historical Nayef College, MD  diltiazem (CARDIZEM CD) 300 MG 24 hr capsule Take 300 mg by mouth daily.     Yes Historical  Lacee Grey, MD  doxazosin (CARDURA) 2 MG tablet Take 2 mg by mouth at bedtime.   Yes Historical Trynity Skousen, MD  FLUoxetine (PROZAC) 40 MG capsule Take 40 mg by mouth daily.     Yes Historical Tejon Gracie, MD  furosemide (LASIX) 20 MG tablet Take 20 mg by mouth daily.  04/15/13  Yes Historical Guthrie Lemme, MD  lenalidomide (REVLIMID) 15 MG capsule Take 15 mg by mouth See admin instructions. Take for 21 days, hold for 8 days and then repeat   Yes Historical Myrta Mercer, MD  loperamide (IMODIUM) 2 MG capsule Take 2 mg by mouth 4 (four) times daily as needed for diarrhea or loose stools.    Yes Historical Donnae Michels, MD  losartan-hydrochlorothiazide (HYZAAR) 100-25 MG per tablet Take 1 tablet by mouth daily.     Yes Historical Jakai Onofre, MD  niacin (NIASPAN) 500 MG CR tablet Take 1,500 mg by mouth at bedtime.    Yes Historical Linlee Cromie, MD  nystatin (MYCOSTATIN/NYSTOP) 100000 UNIT/GM POWD Apply topically 2 (two) times daily as needed (rash).   Yes Historical Payten Beaumier, MD  omeprazole (PRILOSEC OTC) 20 MG tablet Take 20 mg by mouth daily.   Yes  Historical Jaymin Waln, MD  oxyCODONE-acetaminophen (PERCOCET/ROXICET) 5-325 MG per tablet Take 1 tablet by mouth every 4 (four) hours as needed (pain).   Yes Historical Ieesha Abbasi, MD  potassium chloride SA (K-DUR,KLOR-CON) 20 MEQ tablet TAKE THREE TABLETS BY MOUTH DAILY AS DIRECTED 12/30/13  Yes Wyatt Portela, MD  pravastatin (PRAVACHOL) 40 MG tablet Take 40 mg by mouth at bedtime.    Yes Historical Denecia Brunette, MD    Anti-infectives   Start     Dose/Rate Route Frequency Ordered Stop   01/06/14 0600  ceFAZolin (ANCEF) IVPB 2 g/50 mL premix     2 g 100 mL/hr over 30 Minutes Intravenous On call to O.R. 01/05/14 1429 01/06/14 0805      Scheduled Meds: . chlorhexidine  60 mL Topical Once  . fentaNYL      . oxyCODONE-acetaminophen       Continuous Infusions:  PRN Meds:.ondansetron (ZOFRAN) IV, oxyCODONE, oxyCODONE  No Known Allergies  Physical Exam  Vitals  Blood pressure 126/83, pulse 86, temperature 97.6 F (36.4 C), temperature source Oral, resp. rate 17, height 5' 1"  (1.549 m), weight 76.658 kg (169 lb), SpO2 96.00%.   1. General frail elderly Caucasian female lying in bed in NAD although appears somnolent in no distress.  2. moves all 4 extremities to noxious stimuli.  3. Sensation intact all 4 extremities, Plantars down going.  4. Ears and Eyes appear Normal, Conjunctivae clear, PERRLA. Moist Oral Mucosa.  5. Supple Neck, No JVD, No cervical lymphadenopathy appriciated, No Carotid Bruits.  6. Symmetrical Chest wall movement, Good air movement bilaterally, CTAB.  7. RRR, No Gallops, Rubs or Murmurs, No Parasternal Heave.  8. Positive Bowel Sounds, Abdomen Soft, No tenderness, No organomegaly appriciated,No rebound -guarding or rigidity.  9.  No Cyanosis, Normal Skin Turgor, No Skin Rash or Bruise.  10. Good muscle tone,  joints appear normal , no effusions, Normal [name]. Right shoulder and arm in sling  11. No Palpable Lymph Nodes in Neck or Axillae     Data  Review  CBC  Recent Labs Lab 01/06/14 0636  WBC 5.0  HGB 7.9*  HCT 25.7*  PLT 234  MCV 93.1  MCH 28.6  MCHC 30.7  RDW 19.4*   ------------------------------------------------------------------------------------------------------------------  Chemistries   Recent Labs Lab 01/06/14 0636  NA 131*  K 4.0  CL  96  CO2 23  GLUCOSE 100*  BUN 8  CREATININE 0.57  CALCIUM 8.8   ------------------------------------------------------------------------------------------------------------------ estimated creatinine clearance is 51.7 ml/min (by C-G formula based on Cr of 0.57). ------------------------------------------------------------------------------------------------------------------ No results found for this basename: TSH, T4TOTAL, FREET3, T3FREE, THYROIDAB,  in the last 72 hours   Coagulation profile No results found for this basename: INR, PROTIME,  in the last 168 hours ------------------------------------------------------------------------------------------------------------------- No results found for this basename: DDIMER,  in the last 72 hours -------------------------------------------------------------------------------------------------------------------  Cardiac Enzymes No results found for this basename: CK, CKMB, TROPONINI, MYOGLOBIN,  in the last 168 hours ------------------------------------------------------------------------------------------------------------------ No components found with this basename: POCBNP,    ---------------------------------------------------------------------------------------------------------------  Urinalysis No results found for this basename: colorurine, appearanceur, labspec, phurine, glucoseu, hgbur, bilirubinur, ketonesur, proteinur, urobilinogen, nitrite, leukocytesur     Imaging results:   Dg Chest 2 View  01/06/2014   CLINICAL DATA:  Preop for right shoulder surgery, shortness of breath and history of COPD   EXAM: CHEST  2 VIEW  COMPARISON:  Chest x-ray of 02/02/2010  FINDINGS: There are prominent interstitial markings diffusely throughout the lungs with cardiomegaly most consistent with mild interstitial edema. No definite effusion is seen. Fracture of the right humeral neck is noted with degenerative change in the left shoulder.  IMPRESSION: Suspect mild interstitial edema.   Electronically Signed   By: Ivar Drape M.D.   On: 01/06/2014 08:14         Assessment & Plan  1. Hypoxia and hypotension postop in PACU in a patient with COPD. This most likely is secondary to effect of multiple sedative medications she received during her surgery, on exam she is no evidence of COPD exacerbation, as anesthesia is wearing of her vital signs are improving, blood pressures have stabilized last systolic was in 702 range, pulse ox was mid 90s on 2 L nasal cannula oxygen. She received 2 units of packed RBC in PACU.  At this time it might be appropriate to monitor her overnight in stepped-down, will obtain two-view chest x-ray to rule out fluid overload, if blood pressures have stabilized we'll give her a gentle dose of IV Lasix, nebulizer treatment oxygen as needed. Minimize sedatives and hypnotics minimize narcotics. Monitor blood pressure closely.    2. History of multiple myeloma with chronic anemia. She received 2 units of packed RBC in PACU, we'll monitor H&H, Revlimid can be continued after discharge as before.    3. History of atrial fibrillation. Currently appears to be in rate control, no EKG in system, continue Cardizem along with digoxin once taking orally, on 20 mg of Lasix which should be continued from tomorrow.    4. Hypertension. On accommodation of Hyzaar and Cardizem, but should be continued from tomorrow with holding parameters to hold if systolic blood pressure less than 110.    5. GERD. Continue PPI.    6. COPD. No signs of exacerbation, nebulizer treatments oxygen as  needed.    7. Dyslipidemia  - on niacin and Pravachol which can be continued once taking orally.     8. Postop anticoagulation. She has underlying A. fib, her [place] Vasc 2 score certainly is 3 or above. She will be at moderate risk for stroke, we'll request to bridged with heparin and Coumadin per pharmacy till her INR is therapeutic.     Will reevaluate the patient once she is out of PACU in stepped-down in a dresser orders again.     DVT Prophylaxis per primary team which is orthopedics from  my side I will order heparin bridging and Coumadin  AM Labs Ordered, also please review Full Orders  Family Communication: Plan discussed with patient     Thank you for the consult, we will follow the patient with you in the Hospital.   Lala Lund K M.D on 01/06/2014 at 10:32 AM  Between 7am to 7pm - Pager - [phone]  After 7pm go to [email or web address] - password [email or web address]  And look for the night coverage person covering me after hours   Thank you for the consult, we will follow the patient with you in the [facility] Office  [phone]   **Disclaimer: This note may have been dictated with voice recognition software. Similar sounding words can inadvertently be transcribed and this note may contain transcription errors which may not have been corrected upon publication of note.**

## 2014-01-06 NOTE — Transfer of Care (Signed)
Immediate Anesthesia Transfer of Care Note  Patient: Brianna Holden  Procedure(s) Performed: Procedure(s): REVERSE RIGHT  SHOULDER ARTHROPLASTY  (Right)  Patient Location: PACU  Anesthesia Type:General  Level of Consciousness: awake, alert  and oriented  Airway & Oxygen Therapy: Patient Spontanous Breathing and Patient connected to face mask oxygen  Post-op Assessment: Report given to PACU RN, Post -op Vital signs reviewed and stable and Patient moving all extremities  Post vital signs: Reviewed and stable  Complications: No apparent anesthesia complications

## 2014-01-06 NOTE — H&P (Signed)
Brianna Holden    Chief Complaint: RIGHT PROXIMAL HUMUS FRACTURE HPI: The patient is a 80 y.o. female with severely displaced right shoulder proximal humerus fracture with underlying rotator cuff tear arthropathy.  Past Medical History  Diagnosis Date  . Myeloma   . COPD (chronic obstructive pulmonary disease)   . Osteopenia   . Hyperlipidemia   . Hypokalemia   . A-fib   . Depression   . PONV (postoperative nausea and vomiting)   . Dysrhythmia   . Shortness of breath     with activity  . Anxiety   . Frequency of urination   . GERD (gastroesophageal reflux disease)   . Arthritis     Past Surgical History  Procedure Laterality Date  . Thyroidectomy, partial    . Tonsillectomy    . Appendectomy      History reviewed. No pertinent family history.  Social History:  reports that she has never smoked. She does not have any smokeless tobacco history on file. She reports that she drinks about 1.8 ounces of alcohol per week. She reports that she does not use illicit drugs.  Allergies: No Known Allergies  Medications Prior to Admission  Medication Sig Dispense Refill  . albuterol (PROVENTIL HFA;VENTOLIN HFA) 108 (90 BASE) MCG/ACT inhaler Inhale 2 puffs into the lungs 4 (four) times daily as needed for wheezing or shortness of breath.      [name] aspirin EC 81 MG tablet Take 81 mg by mouth daily.      . digoxin (LANOXIN) 0.25 MG tablet Take 250 mcg by mouth daily.        [name] diltiazem (CARDIZEM CD) 300 MG 24 hr capsule Take 300 mg by mouth daily.        [name] doxazosin (CARDURA) 2 MG tablet Take 2 mg by mouth at bedtime.      [name] FLUoxetine (PROZAC) 40 MG capsule Take 40 mg by mouth daily.        . furosemide (LASIX) 20 MG tablet Take 20 mg by mouth daily.       [name] lenalidomide (REVLIMID) 15 MG capsule Take 15 mg by mouth See admin instructions. Take for 21 days, hold for 8 days and then repeat      . loperamide (IMODIUM) 2 MG capsule Take 2 mg by mouth 4 (four) times daily as needed for diarrhea  or loose stools.       [name] losartan-hydrochlorothiazide (HYZAAR) 100-25 MG per tablet Take 1 tablet by mouth daily.        . niacin (NIASPAN) 500 MG CR tablet Take 1,500 mg by mouth at bedtime.       [name] nystatin (MYCOSTATIN/NYSTOP) 100000 UNIT/GM POWD Apply topically 2 (two) times daily as needed (rash).      [name] omeprazole (PRILOSEC OTC) 20 MG tablet Take 20 mg by mouth daily.      [name] oxyCODONE-acetaminophen (PERCOCET/ROXICET) 5-325 MG per tablet Take 1 tablet by mouth every 4 (four) hours as needed (pain).      . potassium chloride SA (K-DUR,KLOR-CON) 20 MEQ tablet TAKE THREE TABLETS BY MOUTH DAILY AS DIRECTED  90 tablet  0  . pravastatin (PRAVACHOL) 40 MG tablet Take 40 mg by mouth at bedtime.          Physical Exam: a and O, right shoulder with diffuse tenderness and painful restricted motion as noted at recent office visit.  Vitals  Temp:  [97.8 F (36.6 C)] 97.8 F (36.6 C) (06/25 0620) Pulse Rate:  [86] 86 (  06/25 0620) Resp:  [18] 18 (06/25 0620) BP: (106)/(65) 106/65 mmHg (06/25 0636) SpO2:  [96 %] 96 % (06/25 0620) Weight:  [76.658 kg (169 lb)] 76.658 kg (169 lb) (06/25 0620)  Assessment/Plan  Impression: RIGHT PROXIMAL HUMUS FRACTURE  Plan of Action: Procedure(s): REVERSE RIGHT  SHOULDER ARTHROPLASTY   SUPPLE,KEVIN M 01/06/2014, 7:20 AM

## 2014-01-06 NOTE — Progress Notes (Signed)
Utilization Review Completed.Dowell, Deborah T6/25/2015  

## 2014-01-07 ENCOUNTER — Encounter (HOSPITAL_COMMUNITY): Payer: Self-pay | Admitting: Orthopedic Surgery

## 2014-01-07 DIAGNOSIS — I4891 Unspecified atrial fibrillation: Secondary | ICD-10-CM

## 2014-01-07 DIAGNOSIS — D638 Anemia in other chronic diseases classified elsewhere: Secondary | ICD-10-CM

## 2014-01-07 DIAGNOSIS — E871 Hypo-osmolality and hyponatremia: Secondary | ICD-10-CM

## 2014-01-07 DIAGNOSIS — J438 Other emphysema: Secondary | ICD-10-CM

## 2014-01-07 LAB — HEPARIN LEVEL (UNFRACTIONATED): HEPARIN UNFRACTIONATED: 0.33 [IU]/mL (ref 0.30–0.70)

## 2014-01-07 LAB — BASIC METABOLIC PANEL
BUN: 7 mg/dL (ref 6–23)
CALCIUM: 8.3 mg/dL — AB (ref 8.4–10.5)
CO2: 24 mEq/L (ref 19–32)
CREATININE: 0.55 mg/dL (ref 0.50–1.10)
Chloride: 92 mEq/L — ABNORMAL LOW (ref 96–112)
GFR calc Af Amer: >90 mL/min (ref >90)
GFR, EST NON AFRICAN AMERICAN: 86 mL/min — AB (ref >90)
Glucose, Bld: 100 mg/dL — ABNORMAL HIGH (ref 70–99)
Potassium: 3.8 mEq/L (ref 3.7–5.3)
Sodium: 126 mEq/L — ABNORMAL LOW (ref 137–147)

## 2014-01-07 LAB — CBC
HCT: 26.5 % — ABNORMAL LOW (ref 36.0–46.0)
Hemoglobin: 8.4 g/dL — ABNORMAL LOW (ref 12.0–15.0)
MCH: 28.9 pg (ref 26.0–34.0)
MCHC: 31.7 g/dL (ref 30.0–36.0)
MCV: 91.1 fL (ref 78.0–100.0)
PLATELETS: 176 10*3/uL (ref 150–400)
RBC: 2.91 MIL/uL — ABNORMAL LOW (ref 3.87–5.11)
RDW: 18.3 % — ABNORMAL HIGH (ref 11.5–15.5)
WBC: 5.3 10*3/uL (ref 4.0–10.5)

## 2014-01-07 LAB — PROTIME-INR
INR: 1.39 (ref 0.00–1.49)
Prothrombin Time: 17.1 seconds — ABNORMAL HIGH (ref 11.6–15.2)

## 2014-01-07 MED ORDER — DILTIAZEM HCL ER COATED BEADS 300 MG PO CP24
300.0000 mg | ORAL_CAPSULE | Freq: Every day | ORAL | Status: DC
  Administered 2014-01-08: 300 mg via ORAL
  Filled 2014-01-07: qty 1

## 2014-01-07 NOTE — Evaluation (Signed)
Physical Therapy Evaluation Patient Details Name: MONIGUE SPRAGGINS MRN: [id number] DOB: 1931-01-28 Today's Date: 01/07/2014   History of Present Illness  80 y.o. female with severely displaced right shoulder proximal humerus fracture with underlying rotator cuff tear arthropathy. Underwent reverse TSA.   Clinical Impression  Pt generally debilitated since fall in May resulting in fx and has been relying on friend to A with care.  At this time feel pt would benefit from CIR to maximize independence and decrease burden of care prior to returning home with friend.  Will continue to follow.      Follow Up Recommendations CIR    Equipment Recommendations   (TBD)    Recommendations for Other Services Rehab consult     Precautions / Restrictions Precautions Precautions: Shoulder Type of Shoulder Precautions: No pendulums. following limits of [name]: 20ER; 45 abd; 60 FF. PROM Shoulder Interventions: Shoulder sling/immobilizer;At all times;Off for dressing/bathing/exercises Precaution Booklet Issued: Yes (comment) Required Braces or Orthoses: Sling Restrictions Weight Bearing Restrictions: Yes RUE Weight Bearing: Non weight bearing Other Position/Activity Restrictions: nolifting/pulling/pushing      Mobility  Bed Mobility Overal bed mobility: Needs Assistance Bed Mobility: Supine to Sit     Supine to sit: Min assist     General bed mobility comments: A to bring hips around to EOB with pt utilizing bed rail to bring trunk up to sitting.    Transfers Overall transfer level: Needs assistance Equipment used: 1 person hand held assist Transfers: Sit to/from Stand Sit to Stand: Min assist         General transfer comment: cues for L UE use and not using R UE.    Ambulation/Gait Ambulation/Gait assistance: Min assist Ambulation Distance (Feet): 10 Feet Assistive device: 1 person hand held assist Gait Pattern/deviations: Step-through pattern;Decreased stride length     General  Gait Details: pt generally unsteady and amb distance limited by fatigue and weakness.    Stairs            Wheelchair Mobility    Modified Rankin (Stroke Patients Only)       Balance Overall balance assessment: Needs assistance Sitting-balance support: Single extremity supported;Feet supported Sitting balance-Leahy Scale: Fair Sitting balance - Comments: pt able to sit without UE support though only statically.     Standing balance support: Single extremity supported Standing balance-Leahy Scale: Poor                               Pertinent Vitals/Pain "Aches"  Premedicated.      Home Living Family/patient expects to be discharged to:: Private residence Living Arrangements: Non-relatives/Friends                    Prior Function Level of Independence: Needs assistance   Gait / Transfers Assistance Needed: pt's friend provides HHA during amb.    ADL's / Homemaking Assistance Needed: pt's friend performs all homemaking tasks and A with had to reach areas for bathing and dressing and toileting.          Hand Dominance        Extremity/Trunk Assessment   Upper Extremity Assessment: Defer to OT evaluation RUE Deficits / Details: s/p reverse TSA         Lower Extremity Assessment: Generalized weakness      Cervical / Trunk Assessment: Kyphotic  Communication   Communication: No difficulties  Cognition Arousal/Alertness: Awake/alert Behavior During Therapy: WFL for tasks assessed/performed Overall Cognitive  Status: Within Functional Limits for tasks assessed                      General Comments      Exercises        Assessment/Plan    PT Assessment Patient needs continued PT services  PT Diagnosis Difficulty walking;Acute pain   PT Problem List Decreased strength;Decreased activity tolerance;Decreased balance;Decreased mobility;Decreased knowledge of use of DME;Decreased knowledge of precautions;Pain  PT Treatment  Interventions DME instruction;Gait training;Stair training;Functional mobility training;Therapeutic activities;Therapeutic exercise;Balance training;Patient/family education   PT Goals (Current goals can be found in the Care Plan section) Acute Rehab PT Goals Patient Stated Goal: Home PT Goal Formulation: With patient Time For Goal Achievement: 01/21/14 Potential to Achieve Goals: Good    Frequency Min 3X/week   Barriers to discharge        Co-evaluation               End of Session Equipment Utilized During Treatment: Gait belt Activity Tolerance: Patient limited by fatigue Patient left: in chair;with call bell/phone within reach;with family/visitor present Nurse Communication: Mobility status         Time: 2023-3435 PT Time Calculation (min): 27 min   Charges:   PT Evaluation $Initial PT Evaluation Tier I: 1 Procedure PT Treatments $Gait Training: 8-22 mins $Therapeutic Activity: 8-22 mins   PT G CodesCatarina Hartshorn, Banks 01/07/2014, 12:30 PM

## 2014-01-07 NOTE — Progress Notes (Signed)
Chart reviewed.   [facility] PROGRESS NOTE  Brianna Holden TDV:[id number] DOB: 30-Nov-1930 DOA: 01/06/2014 PCP: Brianna Holden  Assessment/Plan: Humerus fracture, S/P shoulder replacement Active Problems: Hypotension and hypoxia resolved.  Ok to transfer to tele.  CXR shows fluid overload, but clinically without signs of CHF (lying flat, lungs clear, no dyspnea). No further workup if patient goes home tomorrow.  If she stays, could get nonurgent echo to evaluate EF.  PT /OT rec CIR v. SNF v. Home health with 24 hour supervision   Myeloma   COPD (chronic obstructive pulmonary disease): no wheeze currently   Hyperlipidemia   Hypokalemia corrected   A-fib: on heparin and coumadin per pharmacy.     Anemia, chronic disease   Hyponatremia: mild, asymptomatic.  Check TSH. Monitor. OK FOR DISCHARGE TOMORROW IF SODIUM NO LOWER.  Needs close f/u with PCP to check sodium, H/h, potassium  HPI/Subjective: Wants to go home. No dyspnea.  Objective: Filed Vitals:   01/07/14 1515  BP:   Pulse:   Temp: 97.8 F (36.6 C)  Resp:     Intake/Output Summary (Last 24 hours) at 01/07/14 1557 Last data filed at 01/07/14 1523  Gross per 24 hour  Intake    420 ml  Output   1190 ml  Net   -770 ml   Filed Weights   01/06/14 0620  Weight: 76.658 kg (169 lb)   Tele: atrial fib rate 80  Exam:   General:  Lying flat. comfortable  Cardiovascular: irreg irreg  Respiratory: CTA without WRR  Abdomen: s, Nt, nd  Ext: right arm in sling. No edema  Basic Metabolic Panel:  Recent Labs Lab 01/06/14 0636 01/07/14 0315  NA 131* 126*  K 4.0 3.8  CL 96 92*  CO2 23 24  GLUCOSE 100* 100*  BUN 8 7  CREATININE 0.57 0.55  CALCIUM 8.8 8.3*   Liver Function Tests: No results found for this basename: AST, ALT, ALKPHOS, BILITOT, PROT, ALBUMIN,  in the last 168 hours No results found for this basename: LIPASE, AMYLASE,  in the last 168 hours No results found for this basename: AMMONIA,   in the last 168 hours CBC:  Recent Labs Lab 01/06/14 0636 01/06/14 1130 01/07/14 0315  WBC 5.0 6.1 5.3  HGB 7.9* 8.5* 8.4*  HCT 25.7* 27.1* 26.5*  MCV 93.1 90.9 91.1  PLT 234 192 176   Cardiac Enzymes: No results found for this basename: CKTOTAL, CKMB, CKMBINDEX, TROPONINI,  in the last 168 hours BNP (last 3 results) No results found for this basename: PROBNP,  in the last 8760 hours CBG: No results found for this basename: GLUCAP,  in the last 168 hours  No results found for this or any previous visit (from the past 240 hour(s)).   Studies: Dg Chest 2 View  01/06/2014   CLINICAL DATA:  Preop for right shoulder surgery, shortness of breath and history of COPD  EXAM: CHEST  2 VIEW  COMPARISON:  Chest x-ray of 02/02/2010  FINDINGS: There are prominent interstitial markings diffusely throughout the lungs with cardiomegaly most consistent with mild interstitial edema. No definite effusion is seen. Fracture of the right humeral neck is noted with degenerative change in the left shoulder.  IMPRESSION: Suspect mild interstitial edema.   Electronically Signed   By: Ivar Drape M.D.   On: 01/06/2014 08:14   Dg Chest Port 1 View  01/06/2014   CLINICAL DATA:  Shortness of breath  EXAM: PORTABLE CHEST - 1 VIEW  COMPARISON:  Film from earlier in the same day  FINDINGS: Cardiac shadow remains enlarged. The degree of vascular congestion has increased significantly in the interval from the prior exam. New right basilar atelectatic changes are seen. No acute bony abnormality is noted. Postsurgical changes in the right shoulder are now seen.  IMPRESSION: Increasing congestive failure with associated right basilar atelectasis.  These results were called by telephone at the time of interpretation on 01/06/2014 at 10:56 AM to Brianna Holden the patients nurse, who verbally acknowledged these results.   Electronically Signed   By: Inez Catalina M.D.   On: 01/06/2014 10:57    Scheduled Meds: . aspirin EC  81 mg Oral  Daily  . digoxin  250 mcg Oral Daily  . diltiazem  60 mg Oral 3 times per day  . docusate sodium  100 mg Oral BID  . doxazosin  2 mg Oral QHS  . FLUoxetine  40 mg Oral Daily  . furosemide  20 mg Intravenous Once  . furosemide  20 mg Oral Daily  . lenalidomide  15 mg Oral Q24H  . niacin  1,500 mg Oral QHS  . pantoprazole  40 mg Oral Daily  . potassium chloride SA  20 mEq Oral BID  . pravastatin  40 mg Oral q1800  . warfarin  4 mg Oral q1800  . Warfarin - Pharmacist Dosing Inpatient   Does not apply q1800   Continuous Infusions: . heparin 1,300 Units/hr (01/07/14 1500)  . lactated ringers 75 mL/hr at 01/06/14 1322    Time spent: 35 minutes  [facility] Pager [phone]. If 7PM-7AM, please contact night-coverage at [email or web address], password [facility] 01/07/2014, 3:57 PM  LOS: 1 day

## 2014-01-07 NOTE — Op Note (Signed)
Brianna Holden, Brianna Holden                 ACCOUNT NO.:  [id number]  MEDICAL RECORD NO.:  [id number]  LOCATION:  3S05C                        FACILITY:  [facility]  PHYSICIAN:  Metta Clines. Supple, M.D.  DATE OF BIRTH:  1931/04/29  DATE OF PROCEDURE:  01/06/2014 DATE OF DISCHARGE:                              OPERATIVE REPORT   PREOPERATIVE DIAGNOSIS:  Displaced right proximal humerus fracture with impending malunion/nonunion.  POSTOPERATIVE DIAGNOSIS:  Displaced right proximal humerus fracture with impending malunion/nonunion.  PROCEDURE:  Right shoulder reverse arthroplasty utilizing a cemented size 10 stem, a +9 polyethylene insert, and a 38 eccentric glenosphere.  SURGEON:  Metta Clines. Supple, M.D.  Brianna DupontOlivia Mackie A. Shuford, PA-C.  ANESTHESIA:  General endotracheal as well as an interscalene block.  ESTIMATED BLOOD LOSS:  250 mL.  DRAINS:  Hemovac x1.  INTRAOPERATIVE FLUIDS:  She was given 2 units of packed red cells intraoperatively.  HISTORY:  [name] is an 80 year old female with a number of significant underlying medical comorbidities including chronic anticoagulation on Coumadin, hypertension, rheumatoid arthritis, and recent diagnosis of multiple myeloma, for which she takes monthly chemotherapy.  She has had some chronically low hemoglobins for which she has been followed by her medical team, which has overall been felt to be relatively stable, and now after having undergone extensive preop medical evaluation and clearance, she is brought to the operating room for planned right shoulder reverse arthroplasty for the treatment of her severely displaced and malunited/nonunited proximal humerus fracture occurring on top of an underlying chronic rotator cuff tear arthropathy of the right shoulder.  I preoperatively counseled [name] as well as her family members regarding treatment options and risks versus benefits thereof.  Possible surgical complications were all  reviewed including potential for bleeding, infection, neurovascular injury, failure of the implant, anesthetic complications, and possible need for additional surgery.  She understands and accepts and agrees with our planned procedure.  PROCEDURE IN DETAIL:  After undergoing extensive preoperative medical evaluation and medical optimization in the holding area, she had an interscalene block established by the Anesthesia Department.  She received prophylactic antibiotics.  Brought to the operating room, placed supine on the operating table, underwent smooth induction of a general endotracheal anesthesia.  Placed into beach-chair position and appropriately padded and protected.  Right shoulder girdle region was then sterilely prepped and draped in standard fashion.  Time-out was called.  An anterior approach to the shoulder made through deltopectoral interval and incision approximately 10 cm in length.  Skin flaps were elevated.  Electrocautery was used for hemostasis.  Dissection carried deeply with the cephalic vein identified and retracted laterally at the deltoid.  Pectoralis retracted medially up to 1.5 cm was tenotomized to improve exposure.  Adhesions were noted beneath the deltoid, which we divided and this dissection brought [place] down to proximal fracture site, which significantly displaced humeral shaft medially.  Abundant callus and scar was noted at the level of the impending nonunion.  This was all carefully divided and some moderate blood loss was encountered and as such we went ahead and provided transfusion to use 10 units of packed red cells intraoperatively.  She did remain hemodynamically stable.  At this point, conjoined tendon was mobilized and retracted medially.  The remnant of the rotator cuff was divided and essentially there was no rotator cuff left other than some strands of bursal tissue and essentially no functioning subscapularis and so the cuff of tissue  was simply released and then divided adhesions in the humeral neck region to allow delivery of the denuded humeral head through the wound and this was simply retrieved creating a dissection plane of the fracture site and the head was completely devoid of any soft tissue attachments from the rotator cuff consistent with her known diagnosis of rotator cuff tear arthropathy.  There was a shell of callused and bony remodeling laterally and posteriorly, which we preserved.  This gave [place] access to the glenoid, which was exposed with circumferential dissection removing the peripheral labral tissue and proximal stump of the biceps remnant. Once we gained a complete exposure of the glenoid, we placed a central guide pin, the pin reamed the glenoid to subchondral bony bed.  Central drill hole was placed, we impacted our the glenoid base plate and placed a superior locking screw and anterior-posterior nonlocking screw and inferiorly there was no significant bone for placement of the screws so the inferior screw hole was left empty.  A 38 eccentric glenosphere was then tightened down on the base plate with excellent fixation and I should mention that all screws did have good bony purchase.  Once the glenosphere was placed, we then returned our attention to the humeral shaft and used hand reaming to open the shaft up to size 10.  A distal cement plug was placed the at appropriate level.  The canal was irrigated, dried, and cement was mixed, introduced into the canal in retrograde fashion, and we selected the size 10 stem, which was seated to the appropriate depth at 0 degrees of retroversion based on our trialing and all extra cement was meticulously removed.  We then performed a series of trial reductions and ultimately the +9 polyethylene gave [place] the best soft tissue balance and good stability of the shoulder.  The final +9 poly was impacted.  Final reduction was performed.  Shoulder was taken  through range of motion and it showed excellent stability, good motion, good soft tissue balance.  Wound was then copiously irrigated.  We then confirmed that the subscapularis remnant was nonviable and so we simply placed a Hemovac drain, brought out posterolaterally, closed the deltopectoral interval with a series of interrupted figure-of-eight #1 Vicryl sutures.  2-0 Vicryl was used for the subcu layer and intracuticular 3-0 Monocryl for the skin followed by Dermabond and Aquacel sterile dressing.  Right arm was then placed in a sling.  The patient was awakened and ultimate determination of extubation status would be based on her ventilatory performance with anticipated transfer to the recovery room and close monitoring of her ventilatory status.  Tracy A. Shuford, PA-C was used as an Environmental consultant throughout this case and essential for help with positioning of the patient, positioning of the extremity, management of the retractors, tissue manipulation, implantation of the prosthesis, wound closure, and intraoperative decision making.     Metta Clines. Supple, M.D.     KMS/MEDQ  D:  01/06/2014  T:  01/07/2014  Job:  [id number]

## 2014-01-07 NOTE — Progress Notes (Signed)
Brianna Holden  MRN: [id number] DOB/Age: 78/03/1931 78 y.o. Physician: Rada Hay Procedure: Procedure(s) (LRB): REVERSE RIGHT  SHOULDER ARTHROPLASTY  (Right)     Subjective: Overall Brianna Holden looks good, sats well and currently being weaned off O2,(just turned off). Hasn't been up yet. Pain controlled   Vital Signs Temp:  [97.5 F (36.4 C)-98.5 F (36.9 C)] 98 F (36.7 C) (06/26 0736) Pulse Rate:  [61-87] 71 (06/26 0353) Resp:  [12-24] 13 (06/26 0353) BP: (83-126)/(42-83) 114/67 mmHg (06/26 0555) SpO2:  [87 %-98 %] 95 % (06/26 0353)  Lab Results  Recent Labs  01/06/14 1130 01/07/14 0315  WBC 6.1 5.3  HGB 8.5* 8.4*  HCT 27.1* 26.5*  PLT 192 176   BMET  Recent Labs  01/06/14 0636 01/07/14 0315  NA 131* 126*  K 4.0 3.8  CL 96 92*  CO2 23 24  GLUCOSE 100* 100*  BUN 8 7  CREATININE 0.57 0.55  CALCIUM 8.8 8.3*   INR  Date Value Ref Range Status  01/07/2014 1.39  0.00 - 1.49 Final  03/30/2009 2.00  2.00 - 3.50 Final     INR is useful only to assess adequacy of anticoagulation with coumadin when comparing results from different labs. It should not be used to estimate bleeding risk or presence/abscense of coagulopathy in patients not on coumadin. Expected INR ranges for      nontherapeutic patients is 0.88 - 1.12.     Exam Right shoulder dressing dry. Hemovac drain removed NVI         Plan Will allow PT/OT to work with her, if she does well without desats then transfer her to regular floor after medicine sees Our plan will be to DC to home if all goes well tomorrow AM and cleared by medicine Appreciate Medicine care with her.  Olivia Pavelko for Dr.Kevin Supple 01/07/2014, 8:14 AM

## 2014-01-07 NOTE — Care Management Note (Signed)
    Page 1 of 2   01/08/2014     12:46:02 PM CARE MANAGEMENT NOTE 01/08/2014  Patient:  Brianna Holden, Brianna Holden   Account Number:  [id number]  Date Initiated:  01/07/2014  Documentation initiated by:  GRAVES-BIGELOW,BRENDA  Subjective/Objective Assessment:   Pt admitted for Humerus fracture, S/P shoulder replacement.     Action/Plan:   CM did discuss with pt [place] services and pt stated she would rather go to SNF for rehab. Pt asked for [facility] in [place]. CM did make CSW aware of choice. RN in room during conversation. No Hh set up at this time.   Anticipated DC Date:  01/08/2014   Anticipated DC Plan:  [place] [facility]  CM consult      [facility] Choice  HOME HEALTH  DURABLE MEDICAL EQUIPMENT   Choice offered to / List presented to:  C-1 Patient   DME arranged  3-N-1      DME agency  [place] arranged  HH-1 RN  HH-10 DISEASE MANAGEMENT  HH-2 PT  HH-3 OT      [facility] agency  [facility]   Status of service:  Completed, signed off Medicare Important Message given?  YES (If response is "NO", the following Medicare IM given date fields will be blank) Date Medicare IM given:  01/08/2014 Date Additional Medicare IM given:    Discharge Disposition:  [facility]  Per UR Regulation:  Reviewed for med. necessity/level of care/duration of stay  If discussed at [place] of Stay Meetings, dates discussed:    Comments:  01-08-14 1227 Brianna Krauss, RN,BSN [phone] CM received call back from [facility] with confirmation that they can see the pt. CM will place [place] orders, however MD to place f82f in [place]. DME orders placed and DME 3n1 to be delivered to room before d/c. Order information faxed to [place].  No further needs at this time.   01-08-14 1219 Brianna Krauss, RN,BSN [phone] CM did speak to pt and now she wants to go home with [facility] services. Pt had used [facility] in the past and would like to  use [facility]. CM did call [facility] and is awaiting call back for confirmation on staffing. CM will f/u.

## 2014-01-07 NOTE — Progress Notes (Signed)
Rehab Admissions Coordinator Note:  Patient was screened by Cleatrice Burke for appropriateness for an Inpatient Acute Rehab Consult per PT recommendation.  At this time, we are recommending [place] or home with [facility]. Pt lacks the medical necessity for inpt rehab admission with [facility]. Please call me with any questions.   Cleatrice Burke 01/07/2014, 12:46 PM  I can be reached at [phone].

## 2014-01-07 NOTE — Progress Notes (Signed)
Occupational Therapy Evaluation Patient Details Name: Brianna Holden MRN: [id number] DOB: [birth year] Today's Date: 01/07/2014    History of Present Illness 80 y.o. female with severely displaced right shoulder proximal humerus fracture with underlying rotator cuff tear arthropathy. Underwent reverse TSA.    Clinical Impression   Prior to fall in May, pt was mod I with mobility and self care. Lives with friend who has assisted with taking care of her since fall. Pt with increased deficits  ADL and mobility s/p surgery. At this time, feel pt will benefit from short stay at SNF to facilitate return to PLOF and decrease burden on caregiver. Will follow for ADL retraining and RUE shoulder rehab. Desat to 88-89 RA with minimal activity.     Follow Up Recommendations  SNF;Supervision/Assistance - 24 hour    Equipment Recommendations  3 in 1 bedside comode    Recommendations for Other Services       Precautions / Restrictions Precautions Precautions: Shoulder Type of Shoulder Precautions: No pendulums. following limits of [name]: 20ER; 45 abd; 60 FF. PROM Shoulder Interventions: Shoulder sling/immobilizer;At all times;Off for dressing/bathing/exercises Precaution Booklet Issued: Yes (comment) Required Braces or Orthoses: Sling Restrictions Weight Bearing Restrictions: Yes RUE Weight Bearing: Non weight bearing Other Position/Activity Restrictions: nolifting/pulling/pushing      Mobility Bed Mobility  Transfers Overall transfer level: Needs assistance Equipment used: 1 person hand held assist Transfers: Sit to/from Stand Sit to Stand: Min assist Stand pivot transfers: Min assist       General transfer comment: cues for L UE use and not using R UE.      Balance Overall balance assessment: Needs assistance Sitting-balance support: Single extremity supported;Feet supported Sitting balance-Leahy Scale: Fair Sitting balance - Comments: pt able to sit without UE support though only  statically.     Standing balance support: Single extremity supported Standing balance-Leahy Scale: Poor                              ADL Overall ADL's : Needs assistance/impaired Eating/Feeding: Set up   Grooming: Moderate assistance   Upper Body Bathing: Moderate assistance;Sitting   Lower Body Bathing: Maximal assistance;Sit to/from stand   Upper Body Dressing : Maximal assistance;Sitting   Lower Body Dressing: Maximal assistance;Sit to/from stand   Toilet Transfer: Minimal assistance;Ambulation   Toileting- Clothing Manipulation and Hygiene: Maximal assistance;Sit to/from stand       Functional mobility during ADLs: Minimal assistance General ADL Comments: limited due to shoulder restrictions, pain and decreased balance     Vision                     Perception     Praxis      Pertinent Vitals/Pain Desat to 88-89 RA with sit - stand. No c/o pain. Pt states "discomfort". Repositioned.     Hand Dominance Right   Extremity/Trunk Assessment Upper Extremity Assessment Upper Extremity Assessment: Defer to OT evaluation RUE Deficits / Details: s/p reverse TSA. Pt having difficulty with performing [name] exercises passisvely RUE Coordination: decreased gross motor;decreased fine motor   Lower Extremity Assessment Lower Extremity Assessment: Generalized weakness   Cervical / Trunk Assessment Cervical / Trunk Assessment: Kyphotic   Communication Communication Communication: No difficulties   Cognition Arousal/Alertness: Awake/alert Behavior During Therapy: WFL for tasks assessed/performed Overall Cognitive Status: Within Functional Limits for tasks assessed  General Comments       Exercises Exercises: Other exercises Other Exercises Other Exercises: attempted R shoulder PROM within following [name] limitations: ER 20; Abd 45; FF 60. (Pt trying to actively move RUE)   Shoulder Instructions      Home Living  Family/patient expects to be discharged to:: Private residence Living Arrangements: Non-relatives/Friends Available Help at Discharge: Available 24 hours/day;Friend(s) Type of Home: House       Home Layout: One level     Bathroom Shower/Tub: Tub/shower unit Shower/tub characteristics: Curtain Biochemist, clinical: Handicapped height     Home Equipment: [place] - single point          Prior Functioning/Environment Level of Independence: Needs assistance  Gait / Transfers Assistance Needed: pt's friend provides HHA during amb.   ADL's / Homemaking Assistance Needed: pt's friend performs all homemaking tasks and A with had to reach areas for bathing and dressing and toileting.     Comments: Prior to fall, pt was independent with ADL and mobility    OT Diagnosis: Generalized weakness;Acute pain   OT Problem List: Decreased strength;Decreased range of motion;Decreased activity tolerance;Impaired balance (sitting and/or standing);Decreased coordination;Decreased safety awareness;Decreased knowledge of use of DME or AE;Decreased knowledge of precautions;Cardiopulmonary status limiting activity;Impaired UE functional use;Obesity;Pain   OT Treatment/Interventions: Self-care/ADL training;Therapeutic exercise;DME and/or AE instruction;Therapeutic activities;Patient/family education;Balance training    OT Goals(Current goals can be found in the care plan section) Acute Rehab OT Goals Patient Stated Goal: Home OT Goal Formulation: With patient Time For Goal Achievement: 01/21/14 Potential to Achieve Goals: Good ADL Goals Additional ADL Goal #1: Pt/fmaily will independently manage sling, positioniong of R UE and assist with ADL to facilitate safe D/C home.  OT Frequency: Min 3X/week   Barriers to D/C: Other (comment) (elderly caregiver)          Co-evaluation              End of Session Equipment Utilized During Treatment: Oxygen Nurse Communication: Mobility  status;Precautions  Activity Tolerance: Patient tolerated treatment well Patient left: in chair;with call bell/phone within reach   Time: 1115-1136 OT Time Calculation (min): 21 min Charges:  OT General Charges $OT Visit: 1 Procedure OT Evaluation $Initial OT Evaluation Tier I: 1 Procedure OT Treatments $Self Care/Home Management : 8-22 mins G-Codes:    Mylinda Brook,HILLARY 2014/01/08, 1:19 PM   [facility], OTR/L  [phone] 01/08/2014

## 2014-01-07 NOTE — Progress Notes (Addendum)
ANTICOAGULATION CONSULT NOTE - Follow Up Consult  Pharmacy Consult for Heparin, coumadin Indication: atrial fibrillation  No Known Allergies  Patient Measurements: Height: 5\' 1"  (154.9 cm) Weight: 169 lb (76.658 kg) IBW/kg (Calculated) : 47.8  Vital Signs: Temp: 98.1 F (36.7 C) (06/26 0800) Temp src: Oral (06/26 0800) BP: 125/84 mmHg (06/26 0800) Pulse Rate: 71 (06/26 0353)  Labs:  Recent Labs  01/06/14 0636 01/06/14 1130 01/06/14 2239 01/07/14 0315 01/07/14 0819  HGB 7.9* 8.5*  --  8.4*  --   HCT 25.7* 27.1*  --  26.5*  --   PLT 234 192  --  176  --   LABPROT  --   --   --  17.1*  --   INR  --   --   --  1.39  --   HEPARINUNFRC  --   --  0.20*  --  0.33  CREATININE 0.57  --   --  0.55  --    Estimated Creatinine Clearance: 51.7 ml/min (by C-G formula based on Cr of 0.55).  Medications:  Heparin 1300 units/hr  Assessment: 80 y/o F on heparin/coumadin for afib.  Patient is s/p surgery and coumadin restarted 01/06/14 and pharmacy asked to resume home coumadin dose. INR today is 1.38 and heparin level = 0.33. Noted plans for d/c home in am.  Goal of Therapy:  Heparin level 0.3-0.7 units/ml Monitor platelets by anticoagulation protocol: Yes   Plan:  -No heparin changes needed -Coumadin 4mg  po daily -Daily PT/INR -Daily heparin level and CBC  Hildred Laser, Pharm D 01/07/2014 9:44 AM

## 2014-01-07 NOTE — Progress Notes (Signed)
Patient being transferred to 2 [facility] per MD order. Report called to Katharine Look, Therapist, sports. Patient alert and oriented, all VS stable. Patients friend [place] was notified and updated of new room number.

## 2014-01-07 NOTE — Clinical Documentation Improvement (Signed)
Possible Clinical Conditions?   _______Hyponatremia _______Other Condition _______Cannot Clinically Determine     Diagnostics:  Sodium: 6/26:  126. 6/25:  131.   Thank You, Theron Arista, Clinical Documentation Specialist:  [phone]  [place] Information Management

## 2014-01-08 LAB — PROTIME-INR
INR: 1.52 — ABNORMAL HIGH (ref 0.00–1.49)
PROTHROMBIN TIME: 18.3 s — AB (ref 11.6–15.2)

## 2014-01-08 LAB — BASIC METABOLIC PANEL
BUN: 8 mg/dL (ref 6–23)
CALCIUM: 8.3 mg/dL — AB (ref 8.4–10.5)
CO2: 24 mEq/L (ref 19–32)
CREATININE: 0.6 mg/dL (ref 0.50–1.10)
Chloride: 92 mEq/L — ABNORMAL LOW (ref 96–112)
GFR, EST NON AFRICAN AMERICAN: 83 mL/min — AB (ref >90)
GLUCOSE: 104 mg/dL — AB (ref 70–99)
POTASSIUM: 4.1 meq/L (ref 3.7–5.3)
Sodium: 126 mEq/L — ABNORMAL LOW (ref 137–147)

## 2014-01-08 LAB — CBC
HCT: 24.7 % — ABNORMAL LOW (ref 36.0–46.0)
Hemoglobin: 7.7 g/dL — ABNORMAL LOW (ref 12.0–15.0)
MCH: 28.4 pg (ref 26.0–34.0)
MCHC: 31.2 g/dL (ref 30.0–36.0)
MCV: 91.1 fL (ref 78.0–100.0)
PLATELETS: 185 10*3/uL (ref 150–400)
RBC: 2.71 MIL/uL — ABNORMAL LOW (ref 3.87–5.11)
RDW: 18.3 % — AB (ref 11.5–15.5)
WBC: 6 10*3/uL (ref 4.0–10.5)

## 2014-01-08 LAB — TSH: TSH: 1.18 u[IU]/mL (ref 0.350–4.500)

## 2014-01-08 LAB — HEPARIN LEVEL (UNFRACTIONATED): HEPARIN UNFRACTIONATED: 0.27 [IU]/mL — AB (ref 0.30–0.70)

## 2014-01-08 MED ORDER — WARFARIN SODIUM 4 MG PO TABS: 4.0000 mg | ORAL_TABLET | Freq: Every day | ORAL | Status: DC

## 2014-01-08 MED ORDER — OXYCODONE-ACETAMINOPHEN 5-325 MG PO TABS: 1.0000 | ORAL_TABLET | ORAL | Status: DC | PRN

## 2014-01-08 NOTE — Progress Notes (Signed)
ANTICOAGULATION CONSULT NOTE - Follow Up Consult  Pharmacy Consult for Heparin  Indication: atrial fibrillation  No Known Allergies  Patient Measurements: Height: 5\' 1"  (154.9 cm) Weight: 169 lb (76.658 kg) IBW/kg (Calculated) : 47.8  Vital Signs: Temp: 98.3 F (36.8 C) (06/26 2231) Temp src: Oral (06/26 2231) BP: 102/52 mmHg (06/26 2231) Pulse Rate: 80 (06/26 2231)  Labs:  Recent Labs  01/06/14 0636 01/06/14 1130 01/06/14 2239 01/07/14 0315 01/07/14 0819 01/08/14 0325  HGB 7.9* 8.5*  --  8.4*  --  7.7*  HCT 25.7* 27.1*  --  26.5*  --  24.7*  PLT 234 192  --  176  --  185  LABPROT  --   --   --  17.1*  --  18.3*  INR  --   --   --  1.39  --  1.52*  HEPARINUNFRC  --   --  0.20*  --  0.33 0.27*  CREATININE 0.57  --   --  0.55  --  0.60   Estimated Creatinine Clearance: 51.7 ml/min (by C-G formula based on Cr of 0.6).  Medications:  Heparin 1300 units/hr  Assessment: 80 y/o F on heparin for afib. HL is 0.27. Other labs as above.   Goal of Therapy:  Heparin level 0.3-0.7 units/ml Monitor platelets by anticoagulation protocol: Yes   Plan:  -Increase heparin drip to 1400 units/hr -1330 HL -Daily CBC/HL -Monitor for bleeding  Narda Bonds 01/08/2014,5:10 AM

## 2014-01-08 NOTE — Discharge Summary (Signed)
PATIENT ID:      Brianna Holden  MRN:     [id number] DOB/AGE:    10/21/1930 / 80 y.o.     DISCHARGE SUMMARY  ADMISSION DATE:    01/06/2014 DISCHARGE DATE:    ADMISSION DIAGNOSIS: RIGHT PROXIMAL HUMUS FRACTURE Past Medical History  Diagnosis Date  . Myeloma   . COPD (chronic obstructive pulmonary disease)   . Osteopenia   . Hyperlipidemia   . Hypokalemia   . A-fib   . Depression   . PONV (postoperative nausea and vomiting)   . Dysrhythmia   . Shortness of breath     with activity  . Anxiety   . Frequency of urination   . GERD (gastroesophageal reflux disease)   . Arthritis     DISCHARGE DIAGNOSIS:   Active Problems:   Myeloma   COPD (chronic obstructive pulmonary disease)   Hyperlipidemia   Hypokalemia   A-fib   S/P shoulder replacement   Anemia, chronic disease   Hyponatremia   PROCEDURE: Procedure(s): REVERSE RIGHT  SHOULDER ARTHROPLASTY  on 01/06/2014  CONSULTS:   Hospitalist  HISTORY:  See H&P in chart.  HOSPITAL COURSE:  Brianna Holden is a 81 y.o. admitted on 01/06/2014 with a chief complaint of right shoulder pain following a mechanical fall, and found to have a diagnosis of RIGHT PROXIMAL HUMUS FRACTURE.  They were brought to the operating room on 01/06/2014 and underwent Procedure(s): REVERSE RIGHT  SHOULDER ARTHROPLASTY .    They were given perioperative antibiotics: Anti-infectives   Start     Dose/Rate Route Frequency Ordered Stop   01/06/14 1400  ceFAZolin (ANCEF) IVPB 1 g/50 mL premix     1 g 100 mL/hr over 30 Minutes Intravenous Every 6 hours 01/06/14 1317 01/07/14 0239   01/06/14 0600  ceFAZolin (ANCEF) IVPB 2 g/50 mL premix     2 g 100 mL/hr over 30 Minutes Intravenous On call to O.R. 01/05/14 1429 01/06/14 0805    .  Patient underwent the above named procedure and tolerated it well. Brianna Holden has several coomorbidities and also had some labored breathing as a result of the block therefore she was monitored in a stepdown bed and medicine was  consulted who followed along with [place]. She received 2 units of blood introperatively and remained her usual level of preexisting anemia. She also was hyponatremic but felt stable with fluid restriction.The following day they were hemodynamically stable and pain was controlled on oral analgesics. She was transferred to telemetry and monitored an additional day.They were neurovascularly intact to the operative extremity. OT was ordered and worked with patient per protocol. They were medically and orthopaedically stable for discharge on day 2. Home health was arranged for both therapy and lab monitoring. Her chronic coumadin was also resumed post operatively.    DIAGNOSTIC STUDIES:  RECENT RADIOGRAPHIC STUDIES :  Dg Chest 2 View  01/06/2014   CLINICAL DATA:  Preop for right shoulder surgery, shortness of breath and history of COPD  EXAM: CHEST  2 VIEW  COMPARISON:  Chest x-ray of 02/02/2010  FINDINGS: There are prominent interstitial markings diffusely throughout the lungs with cardiomegaly most consistent with mild interstitial edema. No definite effusion is seen. Fracture of the right humeral neck is noted with degenerative change in the left shoulder.  IMPRESSION: Suspect mild interstitial edema.   Electronically Signed   By: Ivar Drape M.D.   On: 01/06/2014 08:14   Dg Chest Port 1 View  01/06/2014   CLINICAL DATA:  Shortness of breath  EXAM: PORTABLE CHEST - 1 VIEW  COMPARISON:  Film from earlier in the same day  FINDINGS: Cardiac shadow remains enlarged. The degree of vascular congestion has increased significantly in the interval from the prior exam. New right basilar atelectatic changes are seen. No acute bony abnormality is noted. Postsurgical changes in the right shoulder are now seen.  IMPRESSION: Increasing congestive failure with associated right basilar atelectasis.  These results were called by telephone at the time of interpretation on 01/06/2014 at 10:56 AM to Amy the patients nurse, who  verbally acknowledged these results.   Electronically Signed   By: Inez Catalina M.D.   On: 01/06/2014 10:57    RECENT VITAL SIGNS:  Patient Vitals for the past 24 hrs:  BP Temp Temp src Pulse Resp SpO2  01/08/14 0531 96/58 mmHg 98 F (36.7 C) Oral 87 18 93 %  01/07/14 2231 102/52 mmHg 98.3 F (36.8 C) Oral 80 18 94 %  01/07/14 1837 95/54 mmHg 98.5 F (36.9 C) Oral 77 20 96 %  01/07/14 1600 120/62 mmHg 97.8 F (36.6 C) Oral - - -  01/07/14 1515 - 97.8 F (36.6 C) Oral - - -  01/07/14 1333 117/63 mmHg - - - - -  01/07/14 1200 117/63 mmHg 97.8 F (36.6 C) Oral - - -  .  RECENT EKG RESULTS:   No orders found for this or any previous visit.  DISCHARGE INSTRUCTIONS:  Discharge Instructions   Ambulatory referral to [facility]    Complete by:  As directed   Please evaluate Brianna Holden for [facility] to [facility].  Disciplines requested: Nursing and Physical Therapy  Services to provide: Strengthening Exercises, Evaluate and Other: nursing for INR lab draw on monday  Physician to follow patient's care (the person listed here will be responsible for signing ongoing orders): Referring Provider  Requested Start of Care Date: Within 2-3 days  I certify that this patient is under my care and that I, or a Nurse Practitioner or Physician's Assistant working with me, had a face-to-face encounter that meets the physician face-to-face requirements with patient on 01/08/14. The encounter with the patient was in whole, or in part for the following medical condition(s) which is the primary reason for home health care (List medical condition). Shoulder replacement   Special Instructions:  HHOT/PT [name] PROM to shoulder ER 20/ABD45/FE60 Ok for [name] to elbow wrist and hand   Draw PT INR on Monday and refer all coumadin monitoring and labs to Cyndi Bender, [place]  The encounter with the patient was in whole, or in part, for the following medical condition, which is the primary reason for home health  care:  yes  I certify that, based on my findings, the following services are medically necessary home health services:   Physical therapy Nursing    My clinical findings support the need for the above services:  Unable to leave home safely without assistance and/or assistive device  Further, I certify that my clinical findings support that this patient is homebound due to:  Unable to leave home safely without assistance  Reason for Medically Necessary Home Health Services:  Therapy- Therapeutic Exercises to Increase Strength and Endurance  Does the patient have Medicare or Medicaid?:  Yes           DISCHARGE MEDICATIONS:     Medication List         albuterol 108 (90 BASE) MCG/ACT inhaler  Commonly known as:  PROVENTIL HFA;VENTOLIN  HFA  Inhale 2 puffs into the lungs 4 (four) times daily as needed for wheezing or shortness of breath.     aspirin EC 81 MG tablet  Take 81 mg by mouth daily.     digoxin 0.25 MG tablet  Commonly known as:  LANOXIN  Take 250 mcg by mouth daily.     diltiazem 300 MG 24 hr capsule  Commonly known as:  CARDIZEM CD  Take 300 mg by mouth daily.     doxazosin 2 MG tablet  Commonly known as:  CARDURA  Take 2 mg by mouth at bedtime.     FLUoxetine 40 MG capsule  Commonly known as:  PROZAC  Take 40 mg by mouth daily.     furosemide 20 MG tablet  Commonly known as:  LASIX  Take 20 mg by mouth daily.     lenalidomide 15 MG capsule  Commonly known as:  REVLIMID  Take 15 mg by mouth See admin instructions. Take for 21 days, hold for 8 days and then repeat     loperamide 2 MG capsule  Commonly known as:  IMODIUM  Take 2 mg by mouth 4 (four) times daily as needed for diarrhea or loose stools.     losartan-hydrochlorothiazide 100-25 MG per tablet  Commonly known as:  HYZAAR  Take 1 tablet by mouth daily.     niacin 500 MG CR tablet  Commonly known as:  NIASPAN  Take 1,500 mg by mouth at bedtime.     nystatin 100000 UNIT/GM Powd  Apply topically  2 (two) times daily as needed (rash).     omeprazole 20 MG tablet  Commonly known as:  PRILOSEC OTC  Take 20 mg by mouth daily.     oxyCODONE-acetaminophen 5-325 MG per tablet  Commonly known as:  PERCOCET/ROXICET  Take 1 tablet by mouth every 4 (four) hours as needed (pain).     oxyCODONE-acetaminophen 5-325 MG per tablet  Commonly known as:  PERCOCET/ROXICET  Take 1 tablet by mouth every 4 (four) hours as needed for moderate pain.     potassium chloride SA 20 MEQ tablet  Commonly known as:  K-DUR,KLOR-CON  TAKE THREE TABLETS BY MOUTH DAILY AS DIRECTED     pravastatin 40 MG tablet  Commonly known as:  PRAVACHOL  Take 40 mg by mouth at bedtime.     warfarin 4 MG tablet  Commonly known as:  COUMADIN  Take 1 tablet (4 mg total) by mouth daily at 6 PM.        FOLLOW UP VISIT:       Follow-up Information   Follow up with Marin Shutter, MD.   Specialty:  Orthopedic Surgery   Contact information:   [address] [place] [place] [address] [phone]       DISCHARGE TO: Home   DISPOSITION: Good  DISCHARGE CONDITION:  Stable   [place],TRACY for Dr. Justice Britain 01/08/2014, 9:58 AM

## 2014-01-08 NOTE — Discharge Instructions (Signed)
° °  Kevin M. Supple, M.D., F.A.A.O.S. °Orthopaedic Surgery °Specializing in Arthroscopic and Reconstructive °Surgery of the Shoulder and Knee °[phone] °[address] - [place], [place] [address] - Fax [phone] ° ° °POST-OP TOTAL SHOULDER REPLACEMENT INSTRUCTIONS ° °1. Call the office at [phone] to schedule your first post-op appointment 10-14 days from the date of your surgery. ° °2. The bandage over your incision is waterproof. You may begin showering with this dressing on. You may leave this dressing on until first follow up appointment within 2 weeks. If you would like to remove it you may do so after the 5th day. Go slow and tug at the borders gently to break the bond the dressing has with the skin. The steri strips may come off with the dressing. At this point if there is no drainage it is okay to go without a bandage or you may cover it with a light guaze and tape. Leave the steri-strips in place over your incision. You can expect drainage that is bloody or yellow in nature that should gradually decrease from day of surgery. Change your dressing daily until drainage is completely resolved, then you may feel free to go without a bandage. You can also expect significant bruising around your shoulder that will drift down your arm and into your chest wall. This is very normal and should resolve over several days. ° ° 3. Wear your sling/immobilizer at all times except to perform the exercises below or to occasionally let your arm dangle by your side to stretch your elbow. You also need to sleep in your sling immobilizer until instructed otherwise. ° °4. Range of motion to your elbow, wrist, and hand are encouraged 3-5 times daily. Exercise to your hand and fingers helps to reduce swelling you may experience. ° °5. Utilize ice to the shoulder 3-5 times minimum a day and additionally if you are experiencing pain. ° °6. Prescriptions for a pain medication and a muscle relaxant are provided for  you. It is recommended that if you are experiencing pain that you pain medication alone is not controlling, add the muscle relaxant along with the pain medication which can give additional pain relief. The first 1-2 days is generally the most severe of your pain and then should gradually decrease. As your pain lessens it is recommended that you decrease your use of the pain medications to an "as needed basis'" only and to always comply with the recommended dosages of the pain medications. ° °7. Pain medications can produce constipation along with their use. If you experience this, the use of an over the counter stool softener or laxative daily is recommended.  ° °8. For most patients, if insurance allows, home health services to include therapy has been arranged. ° °9. For additional questions or concerns, please do not hesitate to call the office. If after hours there is an answering service to forward your concerns to the physician on call. ° °

## 2014-01-08 NOTE — Progress Notes (Signed)
Assessment unchanged. Discussed D/C instructions with pt including f/u appointments and new medications. Verbalized understanding. RX given to pt. IV and tele removed. Pt left with belongings accompanied by RN.

## 2014-01-08 NOTE — Progress Notes (Signed)
[facility] PROGRESS NOTE  Brianna Holden WVP:[id number] DOB: 06/19/1931 DOA: 01/06/2014 PCP: [name]  Assessment/Plan: Humerus fracture, S/P shoulder replacement  Hypotension and hypoxia resolved.  Ok to transfer to tele.  CXR shows fluid overload, but clinically without signs of CHF (lying flat, lungs clear, no dyspnea). No further workup if patient goes home.  If she stays, could get non-urgent echo to evaluate EF.  PT /OT rec CIR v. SNF v. Home health with 24 hour supervision- per primary -holding parameters on Cardizem    Myeloma    COPD (chronic obstructive pulmonary disease): no wheeze currently    Hyperlipidemia    Hypokalemia corrected    A-fib: on heparin and coumadin per pharmacy.      Anemia, chronic disease with ABLA- per primary    Hyponatremia: mild, asymptomatic.  TSH ok. Monitor. Baseline Na is low 130s Needs close f/u with PCP to check sodium  HPI/Subjective: Feeling better States she drinks 7-8 large cups of water/day  Objective: Filed Vitals:   01/08/14 0531  BP: 96/58  Pulse: 87  Temp: 98 F (36.7 C)  Resp: 18    Intake/Output Summary (Last 24 hours) at 01/08/14 0924 Last data filed at 01/08/14 0801  Gross per 24 hour  Intake    420 ml  Output   1075 ml  Net   -655 ml   Filed Weights   01/06/14 0620  Weight: 76.658 kg (169 lb)   Tele: atrial fib rate 80  Exam:   General:  Lying flat. comfortable  Cardiovascular: irreg irreg  Respiratory: CTA without WRR  Abdomen: s, Nt, nd  Ext: right arm in sling. No edema  Basic Metabolic Panel:  Recent Labs Lab 01/06/14 0636 01/07/14 0315 01/08/14 0325  NA 131* 126* 126*  K 4.0 3.8 4.1  CL 96 92* 92*  CO2 23 24 24   GLUCOSE 100* 100* 104*  BUN 8 7 8   CREATININE 0.57 0.55 0.60  CALCIUM 8.8 8.3* 8.3*   Liver Function Tests: No results found for this basename: AST, ALT, ALKPHOS, BILITOT, PROT, ALBUMIN,  in the last 168 hours No results found for this basename:  LIPASE, AMYLASE,  in the last 168 hours No results found for this basename: AMMONIA,  in the last 168 hours CBC:  Recent Labs Lab 01/06/14 0636 01/06/14 1130 01/07/14 0315 01/08/14 0325  WBC 5.0 6.1 5.3 6.0  HGB 7.9* 8.5* 8.4* 7.7*  HCT 25.7* 27.1* 26.5* 24.7*  MCV 93.1 90.9 91.1 91.1  PLT 234 192 176 185   Cardiac Enzymes: No results found for this basename: CKTOTAL, CKMB, CKMBINDEX, TROPONINI,  in the last 168 hours BNP (last 3 results) No results found for this basename: PROBNP,  in the last 8760 hours CBG: No results found for this basename: GLUCAP,  in the last 168 hours  No results found for this or any previous visit (from the past 240 hour(s)).   Studies: Dg Chest Port 1 View  01/06/2014   CLINICAL DATA:  Shortness of breath  EXAM: PORTABLE CHEST - 1 VIEW  COMPARISON:  Film from earlier in the same day  FINDINGS: Cardiac shadow remains enlarged. The degree of vascular congestion has increased significantly in the interval from the prior exam. New right basilar atelectatic changes are seen. No acute bony abnormality is noted. Postsurgical changes in the right shoulder are now seen.  IMPRESSION: Increasing congestive failure with associated right basilar atelectasis.  These results were called by telephone at the time of  interpretation on 01/06/2014 at 10:56 AM to Amy the patients nurse, who verbally acknowledged these results.   Electronically Signed   By: Inez Catalina M.D.   On: 01/06/2014 10:57    Scheduled Meds: . aspirin EC  81 mg Oral Daily  . digoxin  250 mcg Oral Daily  . diltiazem  300 mg Oral Daily  . docusate sodium  100 mg Oral BID  . doxazosin  2 mg Oral QHS  . FLUoxetine  40 mg Oral Daily  . furosemide  20 mg Intravenous Once  . furosemide  20 mg Oral Daily  . lenalidomide  15 mg Oral Q24H  . niacin  1,500 mg Oral QHS  . pantoprazole  40 mg Oral Daily  . potassium chloride SA  20 mEq Oral BID  . pravastatin  40 mg Oral q1800  . warfarin  4 mg Oral q1800   . Warfarin - Pharmacist Dosing Inpatient   Does not apply q1800   Continuous Infusions: . heparin 1,400 Units/hr (01/08/14 0523)  . lactated ringers 10 mL/hr at 01/07/14 1800    Time spent: 25 minutes  Eulogio Bear  [facility] Pager [phone]. If 7PM-7AM, please contact night-coverage at [email or web address], password [facility] 01/08/2014, 9:24 AM  LOS: 2 days

## 2014-01-10 LAB — TYPE AND SCREEN
ABO/RH(D): B POS
Antibody Screen: NEGATIVE
UNIT DIVISION: 0
UNIT DIVISION: 0
Unit division: 0
Unit division: 0

## 2014-01-11 ENCOUNTER — Telehealth: Payer: Self-pay | Admitting: Oncology

## 2014-01-11 ENCOUNTER — Ambulatory Visit (HOSPITAL_BASED_OUTPATIENT_CLINIC_OR_DEPARTMENT_OTHER): Payer: Medicare Other | Admitting: Oncology

## 2014-01-11 ENCOUNTER — Other Ambulatory Visit (HOSPITAL_BASED_OUTPATIENT_CLINIC_OR_DEPARTMENT_OTHER): Payer: Medicare Other

## 2014-01-11 ENCOUNTER — Encounter: Payer: Self-pay | Admitting: Oncology

## 2014-01-11 VITALS — BP 85/43 | HR 76 | Temp 97.9°F | Resp 18 | Ht 61.0 in | Wt 155.2 lb

## 2014-01-11 DIAGNOSIS — F3289 Other specified depressive episodes: Secondary | ICD-10-CM

## 2014-01-11 DIAGNOSIS — C9001 Multiple myeloma in remission: Secondary | ICD-10-CM

## 2014-01-11 DIAGNOSIS — C9000 Multiple myeloma not having achieved remission: Secondary | ICD-10-CM

## 2014-01-11 DIAGNOSIS — E876 Hypokalemia: Secondary | ICD-10-CM

## 2014-01-11 DIAGNOSIS — F329 Major depressive disorder, single episode, unspecified: Secondary | ICD-10-CM

## 2014-01-11 DIAGNOSIS — D649 Anemia, unspecified: Secondary | ICD-10-CM

## 2014-01-11 DIAGNOSIS — J449 Chronic obstructive pulmonary disease, unspecified: Secondary | ICD-10-CM

## 2014-01-11 LAB — CBC WITH DIFFERENTIAL/PLATELET
BASO%: 0.3 % (ref 0.0–2.0)
Basophils Absolute: 0 10*3/uL (ref 0.0–0.1)
EOS%: 1 % (ref 0.0–7.0)
Eosinophils Absolute: 0.1 10*3/uL (ref 0.0–0.5)
HCT: 24.4 % — ABNORMAL LOW (ref 34.8–46.6)
HGB: 7.8 g/dL — ABNORMAL LOW (ref 11.6–15.9)
LYMPH#: 0.9 10*3/uL (ref 0.9–3.3)
LYMPH%: 15.7 % (ref 14.0–49.7)
MCH: 29.4 pg (ref 25.1–34.0)
MCHC: 32 g/dL (ref 31.5–36.0)
MCV: 91.9 fL (ref 79.5–101.0)
MONO#: 1.1 10*3/uL — ABNORMAL HIGH (ref 0.1–0.9)
MONO%: 20 % — AB (ref 0.0–14.0)
NEUT#: 3.4 10*3/uL (ref 1.5–6.5)
NEUT%: 63 % (ref 38.4–76.8)
Platelets: 175 10*3/uL (ref 145–400)
RBC: 2.65 10*6/uL — AB (ref 3.70–5.45)
RDW: 19.3 % — AB (ref 11.2–14.5)
WBC: 5.4 10*3/uL (ref 3.9–10.3)

## 2014-01-11 LAB — COMPREHENSIVE METABOLIC PANEL (CC13)
ALBUMIN: 2.2 g/dL — AB (ref 3.5–5.0)
ALT: 14 U/L (ref 0–55)
AST: 22 U/L (ref 5–34)
Alkaline Phosphatase: 69 U/L (ref 40–150)
Anion Gap: 5 mEq/L (ref 3–11)
BUN: 6.9 mg/dL — AB (ref 7.0–26.0)
CALCIUM: 8.3 mg/dL — AB (ref 8.4–10.4)
CHLORIDE: 101 meq/L (ref 98–109)
CO2: 24 mEq/L (ref 22–29)
Creatinine: 0.7 mg/dL (ref 0.6–1.1)
Glucose: 117 mg/dl (ref 70–140)
POTASSIUM: 3.5 meq/L (ref 3.5–5.1)
Sodium: 130 mEq/L — ABNORMAL LOW (ref 136–145)
Total Bilirubin: 0.8 mg/dL (ref 0.20–1.20)
Total Protein: 9.3 g/dL — ABNORMAL HIGH (ref 6.4–8.3)

## 2014-01-11 NOTE — Telephone Encounter (Signed)
gv adn printed appt sched anda vs for pt for Aug °

## 2014-01-11 NOTE — Progress Notes (Signed)
OFFICE PROGRESS NOTE    DIAGNOSIS:  80 year old with IgG kappa multiple myeloma with presenting abnormality of elevated total protein. Her workup showed M-spike of 1.96 g/dL. Her bone marrow biopsy on 03/17/2009 showed 24% plasma cell. Cytogenetics were normal. FISH myeloma showed trisomy 11. Skeletal survey showed diffuse osteopenia.   PAST THERAPY: started on Revlimide day 1-21 /Dexamethasone weekly q4 weeks in February 2011 from which she achieved complete response her SPEP/serum free light chains.  She was on maintenance Revlimide 65m PO daily days 1-21 of every 28 days between June 2011 and June 2013 when it was discontinued after 2 years.   CURRENT THERAPY: Revlimid 15 mg daily days 1-21 of a 28 day cycle started in November 2014.   INTERVAL HISTORY: Patient returns for regular follow up visit. Since her last visit, she sustained a fall and had fracture of her right shoulder/humerus. She is status post right shoulder arthroplasty and repair on 01/06/2014. She is recovering slowly from the operation after her recent discharge on 01/08/2014. She still has some fatigue and tiredness. She does report some occasional exertional dyspnea.  She has not reported any new complications from Revlimid which she took throughout her hospitalization.  She denied recurrent infection, abd pain, chest pain, bleeding symptoms, lower back pain, lower extremity weakness, bowel or bladder incontinence. He has not reported any new complications and continue to perform activities of daily living. Her mobility slightly limited and have started physical therapy. Rest of her review of systems unremarkable.  Past Medical History  Diagnosis Date  . Myeloma   . COPD (chronic obstructive pulmonary disease)   . Osteopenia   . Hyperlipidemia   . Hypokalemia   . A-fib   . Depression   . PONV (postoperative nausea and vomiting)   . Dysrhythmia   . Shortness of breath     with activity  . Anxiety   . Frequency of  urination   . GERD (gastroesophageal reflux disease)   . Arthritis     Past Surgical History  Procedure Laterality Date  . Thyroidectomy, partial    . Tonsillectomy    . Appendectomy    . Reverse shoulder arthroplasty Right 01/06/2014    Procedure: REVERSE RIGHT  SHOULDER ARTHROPLASTY ;  Surgeon: KMarin Shutter MD;  [place]  Service: Orthopedics;  Laterality: Right;    Current Outpatient Prescriptions  Medication Sig Dispense Refill  . albuterol (PROVENTIL HFA;VENTOLIN HFA) 108 (90 BASE) MCG/ACT inhaler Inhale 2 puffs into the lungs 4 (four) times daily as needed for wheezing or shortness of breath.      .Marland Kitchenaspirin EC 81 MG tablet Take 81 mg by mouth daily.      . digoxin (LANOXIN) 0.25 MG tablet Take 250 mcg by mouth daily.        .Marland Kitchendiltiazem (CARDIZEM CD) 300 MG 24 hr capsule Take 300 mg by mouth daily.        .Marland Kitchendoxazosin (CARDURA) 2 MG tablet Take 2 mg by mouth at bedtime.      .Marland KitchenFLUoxetine (PROZAC) 40 MG capsule Take 40 mg by mouth daily.        . furosemide (LASIX) 20 MG tablet Take 20 mg by mouth daily.       .Marland Kitchenlenalidomide (REVLIMID) 15 MG capsule Take 15 mg by mouth See admin instructions. Take for 21 days, hold for 8 days and then repeat      . loperamide (IMODIUM) 2 MG capsule Take 2 mg  by mouth 4 (four) times daily as needed for diarrhea or loose stools.       [name] losartan-hydrochlorothiazide (HYZAAR) 100-25 MG per tablet Take 1 tablet by mouth daily.        . niacin (NIASPAN) 500 MG CR tablet Take 1,500 mg by mouth at bedtime.       [name] nystatin (MYCOSTATIN/NYSTOP) 100000 UNIT/GM POWD Apply topically 2 (two) times daily as needed (rash).      [name] omeprazole (PRILOSEC OTC) 20 MG tablet Take 20 mg by mouth daily.      [name] oxyCODONE-acetaminophen (PERCOCET/ROXICET) 5-325 MG per tablet Take 1 tablet by mouth every 4 (four) hours as needed (pain).      [name] oxyCODONE-acetaminophen (PERCOCET/ROXICET) 5-325 MG per tablet Take 1 tablet by mouth every 4 (four) hours as needed for  moderate pain.  50 tablet  0  . potassium chloride SA (K-DUR,KLOR-CON) 20 MEQ tablet TAKE THREE TABLETS BY MOUTH DAILY AS DIRECTED  90 tablet  0  . pravastatin (PRAVACHOL) 40 MG tablet Take 40 mg by mouth at bedtime.       [name] warfarin (COUMADIN) 4 MG tablet Take 1 tablet (4 mg total) by mouth daily at 6 PM.  30 tablet  0   No current facility-administered medications for this visit.    ALLERGIES:  has No Known Allergies.  REVIEW OF SYSTEMS:  The rest of the 14-point review of system was negative.   Filed Vitals:   01/11/14 1452  BP: 85/43  Pulse: 76  Temp: 97.9 F (36.6 C)  Resp: 18   Wt Readings from Last 3 Encounters:  01/11/14 155 lb 3.2 oz (70.398 kg)  01/06/14 169 lb (76.658 kg)  01/06/14 169 lb (76.658 kg)   ECOG Performance status: 1  PHYSICAL EXAMINATION:  Blood pressure 85/43, pulse 76, temperature 97.9 F (36.6 C), temperature source Oral, resp. rate 18, height 5' 1"  (1.549 m), weight 155 lb 3.2 oz (70.398 kg).  General: Awake alert not in any distress. Chronically ill-appearing. Eyes: no scleral icterus.  ENT: There were no oropharyngeal lesions.  Neck was without thyromegaly or lymphadenopathy.  Lungs showed no wheezing or crackles. No dullness to percussion.  Cardiovascular: Regular rate and rhythm, S1/S2. No edema noted. GI: abdomen was soft, flat, nontender, nondistended, without organomegaly. No ascites. Muscoloskeletal: no spinal tenderness of palpation of vertebral spine.  Skin exam was without echymosis, petichae. No rashes. Neuro exam was nonfocal.  Patient is in a wheelchair today and needed assistance.    LABORATORY/RADIOLOGY DATA:  Lab Results  Component Value Date   WBC 5.4 01/11/2014   HGB 7.8* 01/11/2014   HCT 24.4* 01/11/2014   PLT 175 01/11/2014   GLUCOSE 104* 01/08/2014   ALKPHOS 83 12/29/2013   ALT 10 12/29/2013   AST 19 12/29/2013   NA 126* 01/08/2014   K 4.1 01/08/2014   CL 92* 01/08/2014   CREATININE 0.60 01/08/2014   BUN 8 01/08/2014    CO2 24 01/08/2014   INR 1.52* 01/08/2014     Results for KEILANA, MORLOCK (MRN [id number]) as of 01/11/2014 14:55  Ref. Range 07/23/2013 13:11 08/18/2013 10:37 11/10/2013 13:13 11/10/2013 13:13  Kappa free light chain Latest Range: 0.33-1.94 mg/dL 8.92 (H) 14.30 (H)  13.10 (H)   Results for TYESE, FINKEN (MRN [id number]) as of 01/11/2014 14:55  Ref. Range 07/23/2013 13:11 08/18/2013 10:37 11/10/2013 13:13  M-SPIKE, % No range found 1.27 1.60 2.58    ASSESSMENT AND PLAN:   1. History of multiple  myeloma: She has been in remission with Revlimid maintenance until it was stopped in 12/2011.  M spike showed  increase but she continues to be asymptomatic. She has resumed Revlimid 15 mg daily for 21 days and 7 days off beginning in Nov 2014. Her protein studies are showing mild increase but she continues to be asymptomatic. For the time being, we'll continue on the current dose and monitor her closely. I will repeat her protein studies in about 6 weeks and consider salvage therapy if needed to. 2. Status post shoulder arthroplasty after a fall: She is recovering nicely at this time. 3. Anemia: Likely related to her recent surgery I will continue to monitor. She does not require any blood transfusion at this time but certainly she might in the future. 4. Depression: Controlled with fluoxetine per PCP. Her mood was pleasant today.  5. COPD: stable; asymptomatic; on inhalers prn per PCP. 6. DJD:  Appears to be stable at this time. 7. Hypokalemia. Potassium levels are currently pending from today. 8. Follow-up: In 4-5 weeks   [facility] 01/11/2014

## 2014-01-19 ENCOUNTER — Other Ambulatory Visit: Payer: Self-pay | Admitting: *Deleted

## 2014-01-19 ENCOUNTER — Other Ambulatory Visit: Payer: Self-pay | Admitting: Family Medicine

## 2014-01-19 ENCOUNTER — Ambulatory Visit
Admission: RE | Admit: 2014-01-19 | Discharge: 2014-01-19 | Disposition: A | Discharge status: Home / Self Care | Payer: Medicare Other | Source: Ambulatory Visit | Attending: Family Medicine | Admitting: Family Medicine

## 2014-01-19 DIAGNOSIS — R0602 Shortness of breath: Secondary | ICD-10-CM

## 2014-01-19 DIAGNOSIS — C9000 Multiple myeloma not having achieved remission: Secondary | ICD-10-CM

## 2014-01-19 DIAGNOSIS — R059 Cough, unspecified: Secondary | ICD-10-CM

## 2014-01-19 DIAGNOSIS — R05 Cough: Secondary | ICD-10-CM

## 2014-01-19 MED ORDER — LENALIDOMIDE 15 MG PO CAPS: 15.0000 mg | ORAL_CAPSULE | ORAL | Status: DC

## 2014-01-21 ENCOUNTER — Emergency Department (HOSPITAL_COMMUNITY): Payer: Medicare Other

## 2014-01-21 ENCOUNTER — Encounter (HOSPITAL_COMMUNITY): Payer: Self-pay | Admitting: Emergency Medicine

## 2014-01-21 ENCOUNTER — Inpatient Hospital Stay (HOSPITAL_COMMUNITY)
Admission: EM | Admit: 2014-01-21 | Discharge: 2014-01-25 | DRG: 292 | Disposition: A | Discharge status: Skilled Nursing Facility | Payer: Medicare Other | Attending: Internal Medicine | Admitting: Internal Medicine

## 2014-01-21 DIAGNOSIS — J4489 Other specified chronic obstructive pulmonary disease: Secondary | ICD-10-CM | POA: Diagnosis present

## 2014-01-21 DIAGNOSIS — J449 Chronic obstructive pulmonary disease, unspecified: Secondary | ICD-10-CM | POA: Diagnosis present

## 2014-01-21 DIAGNOSIS — D638 Anemia in other chronic diseases classified elsewhere: Secondary | ICD-10-CM | POA: Diagnosis present

## 2014-01-21 DIAGNOSIS — J438 Other emphysema: Secondary | ICD-10-CM

## 2014-01-21 DIAGNOSIS — M129 Arthropathy, unspecified: Secondary | ICD-10-CM | POA: Diagnosis present

## 2014-01-21 DIAGNOSIS — I480 Paroxysmal atrial fibrillation: Secondary | ICD-10-CM

## 2014-01-21 DIAGNOSIS — J410 Simple chronic bronchitis: Secondary | ICD-10-CM

## 2014-01-21 DIAGNOSIS — R945 Abnormal results of liver function studies: Secondary | ICD-10-CM | POA: Diagnosis present

## 2014-01-21 DIAGNOSIS — E876 Hypokalemia: Secondary | ICD-10-CM | POA: Diagnosis not present

## 2014-01-21 DIAGNOSIS — Z96611 Presence of right artificial shoulder joint: Secondary | ICD-10-CM

## 2014-01-21 DIAGNOSIS — I359 Nonrheumatic aortic valve disorder, unspecified: Secondary | ICD-10-CM | POA: Diagnosis present

## 2014-01-21 DIAGNOSIS — I4891 Unspecified atrial fibrillation: Secondary | ICD-10-CM | POA: Diagnosis present

## 2014-01-21 DIAGNOSIS — M899 Disorder of bone, unspecified: Secondary | ICD-10-CM | POA: Diagnosis present

## 2014-01-21 DIAGNOSIS — K219 Gastro-esophageal reflux disease without esophagitis: Secondary | ICD-10-CM | POA: Diagnosis present

## 2014-01-21 DIAGNOSIS — C9000 Multiple myeloma not having achieved remission: Secondary | ICD-10-CM | POA: Diagnosis present

## 2014-01-21 DIAGNOSIS — D649 Anemia, unspecified: Secondary | ICD-10-CM | POA: Diagnosis present

## 2014-01-21 DIAGNOSIS — E785 Hyperlipidemia, unspecified: Secondary | ICD-10-CM | POA: Diagnosis present

## 2014-01-21 DIAGNOSIS — I059 Rheumatic mitral valve disease, unspecified: Secondary | ICD-10-CM | POA: Diagnosis present

## 2014-01-21 DIAGNOSIS — Z7901 Long term (current) use of anticoagulants: Secondary | ICD-10-CM

## 2014-01-21 DIAGNOSIS — I340 Nonrheumatic mitral (valve) insufficiency: Secondary | ICD-10-CM | POA: Diagnosis present

## 2014-01-21 DIAGNOSIS — R7989 Other specified abnormal findings of blood chemistry: Secondary | ICD-10-CM | POA: Diagnosis present

## 2014-01-21 DIAGNOSIS — I509 Heart failure, unspecified: Secondary | ICD-10-CM | POA: Diagnosis present

## 2014-01-21 DIAGNOSIS — I5031 Acute diastolic (congestive) heart failure: Secondary | ICD-10-CM | POA: Diagnosis present

## 2014-01-21 DIAGNOSIS — E871 Hypo-osmolality and hyponatremia: Secondary | ICD-10-CM | POA: Diagnosis present

## 2014-01-21 DIAGNOSIS — I482 Chronic atrial fibrillation, unspecified: Secondary | ICD-10-CM

## 2014-01-21 DIAGNOSIS — R0602 Shortness of breath: Secondary | ICD-10-CM | POA: Diagnosis present

## 2014-01-21 DIAGNOSIS — I350 Nonrheumatic aortic (valve) stenosis: Secondary | ICD-10-CM

## 2014-01-21 DIAGNOSIS — Z96619 Presence of unspecified artificial shoulder joint: Secondary | ICD-10-CM

## 2014-01-21 DIAGNOSIS — M949 Disorder of cartilage, unspecified: Secondary | ICD-10-CM

## 2014-01-21 DIAGNOSIS — I2789 Other specified pulmonary heart diseases: Secondary | ICD-10-CM | POA: Diagnosis present

## 2014-01-21 HISTORY — DX: Multiple myeloma not having achieved remission: C90.00

## 2014-01-21 LAB — COMPREHENSIVE METABOLIC PANEL
ALT: 59 U/L — ABNORMAL HIGH (ref 0–35)
AST: 110 U/L — ABNORMAL HIGH (ref 0–37)
Albumin: 2.4 g/dL — ABNORMAL LOW (ref 3.5–5.2)
Alkaline Phosphatase: 110 U/L (ref 39–117)
Anion gap: 14 (ref 5–15)
BILIRUBIN TOTAL: 1.5 mg/dL — AB (ref 0.3–1.2)
BUN: 18 mg/dL (ref 6–23)
CALCIUM: 7.9 mg/dL — AB (ref 8.4–10.5)
CHLORIDE: 96 meq/L (ref 96–112)
CO2: 17 meq/L — AB (ref 19–32)
Creatinine, Ser: 0.64 mg/dL (ref 0.50–1.10)
GFR calc Af Amer: >90 mL/min (ref >90)
GFR, EST NON AFRICAN AMERICAN: 81 mL/min — AB (ref >90)
Glucose, Bld: 98 mg/dL (ref 70–99)
Potassium: 4.7 mEq/L (ref 3.7–5.3)
Sodium: 127 mEq/L — ABNORMAL LOW (ref 137–147)
Total Protein: 10 g/dL — ABNORMAL HIGH (ref 6.0–8.3)

## 2014-01-21 LAB — CBC
HCT: 23.4 % — ABNORMAL LOW (ref 36.0–46.0)
Hemoglobin: 7.1 g/dL — ABNORMAL LOW (ref 12.0–15.0)
MCH: 28.1 pg (ref 26.0–34.0)
MCHC: 30.3 g/dL (ref 30.0–36.0)
MCV: 92.5 fL (ref 78.0–100.0)
PLATELETS: 151 10*3/uL (ref 150–400)
RBC: 2.53 MIL/uL — AB (ref 3.87–5.11)
RDW: 20.1 % — AB (ref 11.5–15.5)
WBC: 4.1 10*3/uL (ref 4.0–10.5)

## 2014-01-21 LAB — PROTIME-INR
INR: 3.78 — ABNORMAL HIGH (ref 0.00–1.49)
Prothrombin Time: 37.3 seconds — ABNORMAL HIGH (ref 11.6–15.2)

## 2014-01-21 LAB — PRO B NATRIURETIC PEPTIDE: Pro B Natriuretic peptide (BNP): 4862 pg/mL — ABNORMAL HIGH (ref 0–450)

## 2014-01-21 LAB — POC OCCULT BLOOD, ED: FECAL OCCULT BLD: NEGATIVE

## 2014-01-21 LAB — I-STAT TROPONIN, ED: Troponin i, poc: 0.03 ng/mL (ref 0.00–0.08)

## 2014-01-21 LAB — PREPARE RBC (CROSSMATCH)

## 2014-01-21 LAB — NA AND K (SODIUM & POTASSIUM), RAND UR
Potassium Urine: 96 mEq/L
Sodium, Ur: <20 mEq/L

## 2014-01-21 MED ORDER — SODIUM CHLORIDE 0.9 % IJ SOLN: 3.0000 mL | Freq: Two times a day (BID) | INTRAMUSCULAR | Status: DC

## 2014-01-21 MED ORDER — FUROSEMIDE 10 MG/ML IJ SOLN
INTRAMUSCULAR | Status: AC
  Administered 2014-01-21: 20 mg
  Filled 2014-01-21: qty 4

## 2014-01-21 MED ORDER — DILTIAZEM HCL ER COATED BEADS 300 MG PO CP24
300.0000 mg | ORAL_CAPSULE | Freq: Every day | ORAL | Status: DC
  Administered 2014-01-22 – 2014-01-25 (×4): 300 mg via ORAL
  Filled 2014-01-21 (×4): qty 1

## 2014-01-21 MED ORDER — HYDROMORPHONE HCL PF 1 MG/ML IJ SOLN
0.5000 mg | INTRAMUSCULAR | Status: DC | PRN
  Filled 2014-01-21: qty 1

## 2014-01-21 MED ORDER — LOSARTAN POTASSIUM 50 MG PO TABS
100.0000 mg | ORAL_TABLET | Freq: Every day | ORAL | Status: DC
  Administered 2014-01-22: 100 mg via ORAL
  Filled 2014-01-21 (×2): qty 2

## 2014-01-21 MED ORDER — ALUM & MAG HYDROXIDE-SIMETH 200-200-20 MG/5ML PO SUSP: 30.0000 mL | Freq: Four times a day (QID) | ORAL | Status: DC | PRN

## 2014-01-21 MED ORDER — FUROSEMIDE 10 MG/ML IJ SOLN: 20.0000 mg | Freq: Once | INTRAMUSCULAR | Status: AC

## 2014-01-21 MED ORDER — SODIUM CHLORIDE 0.9 % IV SOLN: 250.0000 mL | INTRAVENOUS | Status: DC | PRN

## 2014-01-21 MED ORDER — OXYCODONE HCL 5 MG PO TABS
5.0000 mg | ORAL_TABLET | ORAL | Status: DC | PRN
  Administered 2014-01-22 – 2014-01-24 (×4): 5 mg via ORAL
  Filled 2014-01-21 (×4): qty 1

## 2014-01-21 MED ORDER — NIACIN ER (ANTIHYPERLIPIDEMIC) 500 MG PO TBCR
1500.0000 mg | EXTENDED_RELEASE_TABLET | Freq: Every day | ORAL | Status: DC
  Filled 2014-01-21: qty 3

## 2014-01-21 MED ORDER — FLUOXETINE HCL 20 MG PO CAPS
40.0000 mg | ORAL_CAPSULE | Freq: Every day | ORAL | Status: DC
  Administered 2014-01-22 – 2014-01-25 (×4): 40 mg via ORAL
  Filled 2014-01-21 (×4): qty 2

## 2014-01-21 MED ORDER — NIACIN ER 500 MG PO CPCR
1500.0000 mg | ORAL_CAPSULE | Freq: Every day | ORAL | Status: DC
  Administered 2014-01-21 – 2014-01-24 (×4): 1500 mg via ORAL
  Filled 2014-01-21 (×5): qty 3

## 2014-01-21 MED ORDER — LOSARTAN POTASSIUM-HCTZ 100-25 MG PO TABS: 1.0000 | ORAL_TABLET | ORAL | Status: DC

## 2014-01-21 MED ORDER — ACETAMINOPHEN 325 MG PO TABS: 650.0000 mg | ORAL_TABLET | Freq: Four times a day (QID) | ORAL | Status: DC | PRN

## 2014-01-21 MED ORDER — HYDROCHLOROTHIAZIDE 25 MG PO TABS
25.0000 mg | ORAL_TABLET | Freq: Every day | ORAL | Status: DC
  Administered 2014-01-22: 25 mg via ORAL
  Filled 2014-01-21: qty 1

## 2014-01-21 MED ORDER — ONDANSETRON HCL 4 MG PO TABS
4.0000 mg | ORAL_TABLET | Freq: Four times a day (QID) | ORAL | Status: DC | PRN
  Administered 2014-01-22: 4 mg via ORAL
  Filled 2014-01-21: qty 1

## 2014-01-21 MED ORDER — DIGOXIN 250 MCG PO TABS
250.0000 ug | ORAL_TABLET | Freq: Every day | ORAL | Status: DC
  Administered 2014-01-22 – 2014-01-23 (×2): 250 ug via ORAL
  Filled 2014-01-21 (×2): qty 1

## 2014-01-21 MED ORDER — ACETAMINOPHEN 650 MG RE SUPP: 650.0000 mg | Freq: Four times a day (QID) | RECTAL | Status: DC | PRN

## 2014-01-21 MED ORDER — PRAVASTATIN SODIUM 40 MG PO TABS
40.0000 mg | ORAL_TABLET | Freq: Every day | ORAL | Status: DC
  Administered 2014-01-23 – 2014-01-24 (×2): 40 mg via ORAL
  Filled 2014-01-21 (×5): qty 1

## 2014-01-21 MED ORDER — ONDANSETRON HCL 4 MG/2ML IJ SOLN
4.0000 mg | Freq: Four times a day (QID) | INTRAMUSCULAR | Status: DC | PRN
  Administered 2014-01-24: 4 mg via INTRAVENOUS
  Filled 2014-01-21: qty 2

## 2014-01-21 MED ORDER — SIMVASTATIN 20 MG PO TABS: 20.0000 mg | ORAL_TABLET | Freq: Every day | ORAL | Status: DC

## 2014-01-21 MED ORDER — SODIUM CHLORIDE 0.9 % IJ SOLN: 3.0000 mL | INTRAMUSCULAR | Status: DC | PRN

## 2014-01-21 MED ORDER — SODIUM CHLORIDE 0.9 % IJ SOLN
3.0000 mL | Freq: Two times a day (BID) | INTRAMUSCULAR | Status: DC
  Administered 2014-01-22 – 2014-01-25 (×6): 3 mL via INTRAVENOUS

## 2014-01-21 NOTE — ED Provider Notes (Signed)
CSN: [id number]     Arrival date & time 01/21/14  1404 History   First MD Initiated Contact with Patient 01/21/14 1550     Chief Complaint  Patient presents with  . Shortness of Breath  . Abnormal Lab    hgb 6.5 and Inr 3.9     (Consider location/radiation/quality/duration/timing/severity/associated sxs/prior Treatment) Patient is a 80 y.o. female presenting with shortness of breath. The history is provided by the patient.  Shortness of Breath Severity:  Moderate Associated symptoms: no abdominal pain, no chest pain, no headaches, no rash and no vomiting    patient has increasing shortness of breath and fatigue. She has had anemia which was presumed to be from her recent shoulder surgery. She's been seen by her oncologist, Dr. Alen Blew, and he is aware the anemia. Decision was not transfuse at that time, but he stated in the note that he may need to transfuse her later. She's become more fatigued over last 2 days. She seen a primary care doctor had laboratory. Her INR for A. fib was elevated at 5. Her hemoglobin has gone down to 6.5 the office. She's been in the sevens and eights recently. No blood in stool. She states she has had a little bit of cough and brought up a little bit of blood. No chest pain. No pain with breathing, just feeling more short of breath.  Past Medical History  Diagnosis Date  . Myeloma   . COPD (chronic obstructive pulmonary disease)   . Osteopenia   . Hyperlipidemia   . Hypokalemia   . A-fib   . Depression   . PONV (postoperative nausea and vomiting)   . Dysrhythmia   . Shortness of breath     with activity  . Anxiety   . Frequency of urination   . GERD (gastroesophageal reflux disease)   . Arthritis    Past Surgical History  Procedure Laterality Date  . Thyroidectomy, partial    . Tonsillectomy    . Appendectomy    . Reverse shoulder arthroplasty Right 01/06/2014    Procedure: REVERSE RIGHT  SHOULDER ARTHROPLASTY ;  Surgeon: Marin Shutter, MD;   Location: Home;  Service: Orthopedics;  Laterality: Right;   Family History  Problem Relation Age of Onset  . Cancer Mother     Unknown Type  . Cancer Father     Oral  . Cancer Son     Brain  . Rheum arthritis Mother    History  Substance Use Topics  . Smoking status: Never Smoker   . Smokeless tobacco: Not on file  . Alcohol Use: 1.8 oz/week    3 Cans of beer per week   OB History   Grav Para Term Preterm Abortions TAB SAB Ect Mult Living                 Review of Systems  Constitutional: Positive for fatigue. Negative for activity change and appetite change.  Eyes: Negative for pain.  Respiratory: Positive for shortness of breath. Negative for chest tightness.        Hemoptysis   Cardiovascular: Negative for chest pain and leg swelling.  Gastrointestinal: Negative for nausea, vomiting, abdominal pain and diarrhea.  Genitourinary: Negative for flank pain.  Musculoskeletal: Negative for back pain and neck stiffness.  Skin: Negative for rash.  Neurological: Negative for weakness, numbness and headaches.  Psychiatric/Behavioral: Negative for behavioral problems.      Allergies  Review of patient's allergies indicates no known allergies.  Home  Medications   Prior to Admission medications   Medication Sig Start Date End Date Taking? Authorizing Provider  albuterol (PROVENTIL HFA;VENTOLIN HFA) 108 (90 BASE) MCG/ACT inhaler Inhale 2 puffs into the lungs 3 (three) times daily as needed for wheezing or shortness of breath.    Yes Historical Provider, MD  digoxin (LANOXIN) 0.25 MG tablet Take 250 mcg by mouth daily.     Yes Historical Provider, MD  diltiazem (CARDIZEM CD) 300 MG 24 hr capsule Take 300 mg by mouth daily.     Yes Historical Provider, MD  FLUoxetine (PROZAC) 40 MG capsule Take 40 mg by mouth daily.     Yes Historical Provider, MD  Hypromellose (GENTEAL OP) Place 1 drop into both eyes daily as needed (scratchy eyes).   Yes Historical Provider, MD   lenalidomide (REVLIMID) 15 MG capsule Take 1 capsule (15 mg total) by mouth See admin instructions. Take for 21 days, hold for 7 days and then repeat 01/19/14  Yes Wyatt Portela, MD  loperamide (IMODIUM) 2 MG capsule Take 2 mg by mouth 4 (four) times daily as needed for diarrhea or loose stools.    Yes Historical Provider, MD  niacin (NIASPAN) 500 MG CR tablet Take 1,500 mg by mouth at bedtime.    Yes Historical Provider, MD  omeprazole (PRILOSEC OTC) 20 MG tablet Take 20 mg by mouth daily.   Yes Historical Provider, MD  oxyCODONE-acetaminophen (PERCOCET/ROXICET) 5-325 MG per tablet Take 1 tablet by mouth every 4 (four) hours as needed (pain).   Yes Historical Provider, MD  potassium chloride SA (K-DUR,KLOR-CON) 20 MEQ tablet Take 60 mEq by mouth at bedtime.   Yes Historical Provider, MD  pravastatin (PRAVACHOL) 40 MG tablet Take 40 mg by mouth at bedtime.    Yes Historical Provider, MD  losartan-hydrochlorothiazide (HYZAAR) 100-25 MG per tablet Take 1 tablet by mouth See admin instructions. Take 1 tablet by mouth daily after BP goes back up to 140/90    Historical Provider, MD  warfarin (COUMADIN) 4 MG tablet Take 1 tablet (4 mg total) by mouth daily at 6 PM. 01/08/14   Olivia Mackie Shuford, PA-C   BP 141/88  Pulse 94  Temp(Src) 98.5 F (36.9 C) (Oral)  Resp 20  Ht 5\' 1"  (1.549 m)  Wt 166 lb 14.4 oz (75.705 kg)  BMI 31.55 kg/m2  SpO2 95% Physical Exam  Nursing note and vitals reviewed. Constitutional: She is oriented to person, place, and time. She appears well-developed and well-nourished.  HENT:  Head: Normocephalic and atraumatic.  Eyes: EOM are normal. Pupils are equal, round, and reactive to light.  Neck: Normal range of motion. Neck supple.  Cardiovascular: Normal rate and normal heart sounds.   No murmur heard. Pulmonary/Chest: Effort normal and breath sounds normal. No respiratory distress. She has no wheezes. She has no rales.  Abdominal: Soft. Bowel sounds are normal. She exhibits no  distension. There is no tenderness. There is no rebound and no guarding.  Musculoskeletal: Normal range of motion. She exhibits tenderness.  Right shoulder in sling tenderness superiorly  Neurological: She is alert and oriented to person, place, and time. No cranial nerve deficit.  Skin: Skin is warm and dry. There is pallor.  Psychiatric: She has a normal mood and affect. Her speech is normal.    ED Course  Procedures (including critical care time) Labs Review Labs Reviewed  CBC - Abnormal; Notable for the following:    RBC 2.53 (*)    Hemoglobin 7.1 (*)  HCT 23.4 (*)    RDW 20.1 (*)    All other components within normal limits  PROTIME-INR - Abnormal; Notable for the following:    Prothrombin Time 37.3 (*)    INR 3.78 (*)    All other components within normal limits  COMPREHENSIVE METABOLIC PANEL - Abnormal; Notable for the following:    Sodium 127 (*)    CO2 17 (*)    Calcium 7.9 (*)    Total Protein 10.0 (*)    Albumin 2.4 (*)    AST 110 (*)    ALT 59 (*)    Total Bilirubin 1.5 (*)    GFR calc non Af Amer 81 (*)    All other components within normal limits  PRO B NATRIURETIC PEPTIDE - Abnormal; Notable for the following:    Pro B Natriuretic peptide (BNP) 4862.0 (*)    All other components within normal limits  NA AND K (SODIUM & POTASSIUM), RAND UR  OSMOLALITY, URINE  BASIC METABOLIC PANEL  CBC  I-STAT TROPOININ, ED  POC OCCULT BLOOD, ED  TYPE AND SCREEN  PREPARE RBC (CROSSMATCH)    Imaging Review Dg Chest 2 View  01/21/2014   CLINICAL DATA:  Hemoptysis.  EXAM: CHEST  2 VIEW  COMPARISON:  Chest x-ray 01/19/2014, 01/06/2014, 02/02/2010.  FINDINGS: Mediastinum and hilar structures are normal. Cardiomegaly with increased pulmonary vascularity and interstitial prominence noted. These findings are consistent with congestive heart failure. No pleural effusion or pneumothorax. Right shoulder replacement. Degenerative changes thoracic spine and left shoulder.   IMPRESSION: 1. Congestive heart failure with pulmonary interstitial edema. 2. Right shoulder arthroplasty.   Electronically Signed   By: [name]  Register   On: 01/21/2014 19:24     EKG Interpretation   Date/Time:  Friday January 21 2014 14:45:57 EDT Ventricular Rate:  96 PR Interval:    QRS Duration: 92 QT Interval:  348 QTC Calculation: 439 R Axis:   6 Text Interpretation:  Atrial fibrillation Cannot rule out Anterior infarct  , age undetermined Abnormal ECG Confirmed by Alvino Chapel  MD, Liana Camerer [phone])  on 01/21/2014 5:42:31 PM      MDM   Final diagnoses:  Anemia, unspecified anemia type    Patient with shortness of breath. History of anemia. Is on Coumadin and INR had been elevated to 5. Hemoglobin has been low since shoulder surgery. Discussed with hematology. Denied any further blood tests this time. Likely her cancer and recent surgery. Patient is guaiac-negative. Will admit to internal medicine and transfuse 2 units    Jasper Riling. Alvino Chapel, MD 01/22/14 0092

## 2014-01-21 NOTE — ED Notes (Signed)
Pt has had right shoulder surgery and was placed on coumadin.  Now with sob and went to MD office and INR 3.9 and Hgb 6.5.  Pt sob on exertion.  Pale at triage.  Denies chest pain

## 2014-01-21 NOTE — ED Notes (Signed)
Pt placed on monitor upon arrival to room. Pt monitored by 5 lead, blood pressure, pulse ox.

## 2014-01-21 NOTE — H&P (Signed)
[facility] History and Physical  Brianna Holden QIH:[id number] DOB: 1930/09/19 DOA: 01/21/2014  Referring physician:  EDP PCP: [name] , Dr. Lisbeth Ply Specialists:   Chief Complaint:  SOB  HPI: Brianna Holden is a 80 y.o. female with a history of Multiple Myeloma, Atrial Fibrillation on coumadin Rx, and Anemia of Chronic Disease, who present tot he ED with complaints of worsening SOB over the past 10 days.  She denies fevers or chills or chest pain,  She had a right Shoulder Reverse Arthroplasty performed on 06/25.    Her hemoglobin level was 8.2 on 07/06 and today on re-check in the Ed was 7.1 her FOBT was Heme Negative.   And her INR was also elevated at 3.78, and has been on hold since 07/06 when her INR was 5.1 .  She see [name]  for Oncology.      Review of Systems:  Constitutional: No Weight Loss, No Weight Gain, Night Sweats, Fevers, Chills, Fatigue, or Generalized Weakness HEENT: No Headaches, Difficulty Swallowing,Tooth/Dental Problems,Sore Throat,  No Sneezing, Rhinitis, Ear Ache, Nasal Congestion, or Post Nasal Drip,  Cardio-vascular:  No Chest pain, Orthopnea, PND, Edema in lower extremities, Anasarca, Dizziness, Palpitations  Resp:  +Dyspnea, No DOE, No Cough, No Hemoptysis,  No Wheezing.    GI: No Heartburn, Indigestion, Abdominal Pain, Nausea, Vomiting, Diarrhea, Change in Bowel Habits,  Loss of Appetite  GU: No Dysuria, Change in Color of Urine, No Urgency or Frequency.  No flank pain.  Musculoskeletal: No Joint Pain or Swelling.  No Decreased Range of Motion. No Back Pain.  Neurologic: No Syncope, No Seizures, Muscle Weakness, Paresthesia, Vision Disturbance or Loss, No Diplopia, No Vertigo, No Difficulty Walking,  Skin: No Rash or Lesions. Psych: No Change in Mood or Affect. No Depression or Anxiety. No Memory loss. No Confusion or Hallucinations   Past Medical History  Diagnosis Date  . Myeloma   . COPD (chronic obstructive pulmonary disease)   .  Osteopenia   . Hyperlipidemia   . Hypokalemia   . A-fib   . Depression   . PONV (postoperative nausea and vomiting)   . Dysrhythmia   . Shortness of breath     with activity  . Anxiety   . Frequency of urination   . GERD (gastroesophageal reflux disease)   . Arthritis       Past Surgical History  Procedure Laterality Date  . Thyroidectomy, partial    . Tonsillectomy    . Appendectomy    . Reverse shoulder arthroplasty Right 01/06/2014    Procedure: REVERSE RIGHT  SHOULDER ARTHROPLASTY ;  Surgeon: Marin Shutter, MD;  Location: [place];  Service: Orthopedics;  Laterality: Right;       Prior to Admission medications   Medication Sig Start Date End Date Taking? Authorizing Provider  albuterol (PROVENTIL HFA;VENTOLIN HFA) 108 (90 BASE) MCG/ACT inhaler Inhale 2 puffs into the lungs 3 (three) times daily as needed for wheezing or shortness of breath.    Yes Historical Provider, MD  digoxin (LANOXIN) 0.25 MG tablet Take 250 mcg by mouth daily.     Yes Historical Provider, MD  diltiazem (CARDIZEM CD) 300 MG 24 hr capsule Take 300 mg by mouth daily.     Yes Historical Provider, MD  FLUoxetine (PROZAC) 40 MG capsule Take 40 mg by mouth daily.     Yes Historical Provider, MD  Hypromellose (GENTEAL OP) Place 1 drop into both eyes daily as needed (scratchy eyes).   Yes Historical  Provider, MD  lenalidomide (REVLIMID) 15 MG capsule Take 1 capsule (15 mg total) by mouth See admin instructions. Take for 21 days, hold for 7 days and then repeat 01/19/14  Yes Wyatt Portela, MD  loperamide (IMODIUM) 2 MG capsule Take 2 mg by mouth 4 (four) times daily as needed for diarrhea or loose stools.    Yes Historical Provider, MD  niacin (NIASPAN) 500 MG CR tablet Take 1,500 mg by mouth at bedtime.    Yes Historical Provider, MD  omeprazole (PRILOSEC OTC) 20 MG tablet Take 20 mg by mouth daily.   Yes Historical Provider, MD  oxyCODONE-acetaminophen (PERCOCET/ROXICET) 5-325 MG per tablet Take 1 tablet by mouth  every 4 (four) hours as needed (pain).   Yes Historical Provider, MD  potassium chloride SA (K-DUR,KLOR-CON) 20 MEQ tablet Take 60 mEq by mouth at bedtime.   Yes Historical Provider, MD  pravastatin (PRAVACHOL) 40 MG tablet Take 40 mg by mouth at bedtime.    Yes Historical Provider, MD  losartan-hydrochlorothiazide (HYZAAR) 100-25 MG per tablet Take 1 tablet by mouth See admin instructions. Take 1 tablet by mouth daily after BP goes back up to 140/90    Historical Provider, MD  warfarin (COUMADIN) 4 MG tablet Take 1 tablet (4 mg total) by mouth daily at 6 PM. 01/08/14   Jenetta Loges, PA-C      No Known Allergies   Social History:  reports that she has never smoked. She does not have any smokeless tobacco history on file. She reports that she drinks about 1.8 ounces of alcohol per week. She reports that she does not use illicit drugs.     Family History  Problem Relation Age of Onset  . Cancer Mother     Unknown Type  . Cancer Father     Oral  . Cancer Son     Brain  . Rheum arthritis Mother        Physical Exam:  GEN:  Pleasant Elderly Obese  80 y.o. Caucasian female examined and in no acute distress; cooperative with exam Filed Vitals:   01/21/14 1802 01/21/14 1813 01/21/14 1825 01/21/14 1914  BP: 126/87 131/81 124/77 126/86  Pulse: 93 97 94 106  Temp:  97.5 F (36.4 C)    TempSrc:  Oral    Resp: 19 20 19 22   SpO2: 97% 100% 100% 95%   Blood pressure 126/86, pulse 106, temperature 97.5 F (36.4 C), temperature source Oral, resp. rate 22, SpO2 95.00%. PSYCH: She is alert and oriented x4; does not appear anxious does not appear depressed; affect is normal HEENT: Normocephalic and Atraumatic, Mucous membranes pink; PERRLA; EOM intact; Fundi:  Benign;  No scleral icterus, Nares: Patent, Oropharynx: Clear, Fair Dentition, Neck:  FROM, no cervical lymphadenopathy nor thyromegaly or carotid bruit; no JVD; Breasts:: Not examined CHEST WALL: No tenderness CHEST: Normal  respiration, clear to auscultation bilaterally HEART: Irregular Rhythm; no murmurs rubs or gallops BACK: No kyphosis or scoliosis; no CVA tenderness ABDOMEN: Positive Bowel Sounds,  Obese, soft non-tender; no masses, no organomegaly. Rectal Exam: Not done EXTREMITIES: Right UE in Sling/Immobilizer, No cyanosis, clubbing,  2+ EDEMA of BLEs, no ulcerations. Genitalia: not examined PULSES: 2+ and symmetric SKIN: Normal hydration no rash or ulceration CNS:  Alert and Oriented x 4, No Focal Deficits  Vascular: pulses palpable throughout    Labs on Admission:  Basic Metabolic Panel:  Recent Labs Lab 01/21/14 1505  NA 127*  K 4.7  CL 96  CO2 17*  GLUCOSE 98  BUN 18  CREATININE 0.64  CALCIUM 7.9*   Liver Function Tests:  Recent Labs Lab 01/21/14 1505  AST 110*  ALT 59*  ALKPHOS 110  BILITOT 1.5*  PROT 10.0*  ALBUMIN 2.4*   No results found for this basename: LIPASE, AMYLASE,  in the last 168 hours No results found for this basename: AMMONIA,  in the last 168 hours CBC:  Recent Labs Lab 01/21/14 1505  WBC 4.1  HGB 7.1*  HCT 23.4*  MCV 92.5  PLT 151   Cardiac Enzymes: No results found for this basename: CKTOTAL, CKMB, CKMBINDEX, TROPONINI,  in the last 168 hours  BNP (last 3 results) No results found for this basename: PROBNP,  in the last 8760 hours CBG: No results found for this basename: GLUCAP,  in the last 168 hours  Radiological Exams on Admission: Dg Chest 2 View  01/21/2014   CLINICAL DATA:  Hemoptysis.  EXAM: CHEST  2 VIEW  COMPARISON:  Chest x-ray 01/19/2014, 01/06/2014, 02/02/2010.  FINDINGS: Mediastinum and hilar structures are normal. Cardiomegaly with increased pulmonary vascularity and interstitial prominence noted. These findings are consistent with congestive heart failure. No pleural effusion or pneumothorax. Right shoulder replacement. Degenerative changes thoracic spine and left shoulder.  IMPRESSION: 1. Congestive heart failure with pulmonary  interstitial edema. 2. Right shoulder arthroplasty.   Electronically Signed   By: [name]  Register   On: 01/21/2014 19:24      EKG: Independently reviewed. Atrial Fibrillation rate 96    Assessment/Plan:   80 y.o. female with  Principal Problem:   Anemia Active Problems:   SOB (shortness of breath)   Hyponatremia   Myeloma   COPD (chronic obstructive pulmonary disease)   Hyperlipidemia   A-fib   S/P shoulder replacement    1.   Anemia-  Multifactorial- due to Chronic Disease, Multiple Myeloma, and Recent Surgery,   FOBT was Negative,   1 Unit of PRBCs being transfused.     2.  SOB-  Due to #1.  Check Pro BNP and chest X-ray .  Lasix after  Transfusion.    3.   Hyponatremia-   Due to HCTZ,  Check Urine Na+ and Urine Osm.  Monitor Na+level.     4.   Myeloma-  Followed by Oncology [name].    5.   COPD-stable.    6.    Atrial fibrillation- on coumadin RX, and diltiazem Rx.  Rate currently controlled  7.   Supra-Therapeutic Coumadin level-  Coumadin has been on hold since 07/06 when the level was 5.1,  Give 1/2 of a Vitamin K (2.5 mg)    tablet and holding further coumadin,  and monitor PT/INR levels daily   8.   S/P Rt Shoulder Arthroplasty- on 06/25 by Dr Maureen Ralphs.        Code Status:  FULL CODE  Family Communication:   Family at Bedside Disposition Plan:      Inpatient   Time spent:  Bryant C [facility] Pager [phone]  If 7PM-7AM, please contact night-coverage [email or web address] Password [facility] 01/21/2014, 8:28 PM

## 2014-01-22 DIAGNOSIS — J410 Simple chronic bronchitis: Secondary | ICD-10-CM

## 2014-01-22 LAB — BASIC METABOLIC PANEL
Anion gap: 13 (ref 5–15)
BUN: 21 mg/dL (ref 6–23)
CHLORIDE: 97 meq/L (ref 96–112)
CO2: 19 meq/L (ref 19–32)
Calcium: 7.8 mg/dL — ABNORMAL LOW (ref 8.4–10.5)
Creatinine, Ser: 0.68 mg/dL (ref 0.50–1.10)
GFR calc Af Amer: >90 mL/min (ref >90)
GFR calc non Af Amer: 80 mL/min — ABNORMAL LOW (ref >90)
GLUCOSE: 100 mg/dL — AB (ref 70–99)
Potassium: 4.6 mEq/L (ref 3.7–5.3)
SODIUM: 129 meq/L — AB (ref 137–147)

## 2014-01-22 LAB — URINALYSIS, ROUTINE W REFLEX MICROSCOPIC
BILIRUBIN URINE: NEGATIVE
Glucose, UA: NEGATIVE mg/dL
KETONES UR: 15 mg/dL — AB
Leukocytes, UA: NEGATIVE
Nitrite: NEGATIVE
Protein, ur: 30 mg/dL — AB
SPECIFIC GRAVITY, URINE: 1.021 (ref 1.005–1.030)
Urobilinogen, UA: 1 mg/dL (ref 0.0–1.0)
pH: 5.5 (ref 5.0–8.0)

## 2014-01-22 LAB — TYPE AND SCREEN
ABO/RH(D): B POS
Antibody Screen: NEGATIVE
UNIT DIVISION: 0
Unit division: 0

## 2014-01-22 LAB — URINE MICROSCOPIC-ADD ON

## 2014-01-22 LAB — OSMOLALITY, URINE: Osmolality, Ur: 697 mOsm/kg (ref 390–1090)

## 2014-01-22 LAB — CBC
HEMATOCRIT: 30.5 % — AB (ref 36.0–46.0)
Hemoglobin: 9.4 g/dL — ABNORMAL LOW (ref 12.0–15.0)
MCH: 29 pg (ref 26.0–34.0)
MCHC: 30.8 g/dL (ref 30.0–36.0)
MCV: 94.1 fL (ref 78.0–100.0)
Platelets: 132 10*3/uL — ABNORMAL LOW (ref 150–400)
RBC: 3.24 MIL/uL — ABNORMAL LOW (ref 3.87–5.11)
RDW: 18.9 % — ABNORMAL HIGH (ref 11.5–15.5)
WBC: 4.1 10*3/uL (ref 4.0–10.5)

## 2014-01-22 LAB — PROTIME-INR
INR: 2.7 — AB (ref 0.00–1.49)
Prothrombin Time: 28.7 seconds — ABNORMAL HIGH (ref 11.6–15.2)

## 2014-01-22 MED ORDER — WARFARIN SODIUM 1 MG PO TABS
1.0000 mg | ORAL_TABLET | Freq: Once | ORAL | Status: AC
  Administered 2014-01-22: 1 mg via ORAL
  Filled 2014-01-22: qty 1

## 2014-01-22 MED ORDER — PANTOPRAZOLE SODIUM 20 MG PO TBEC
20.0000 mg | DELAYED_RELEASE_TABLET | Freq: Every day | ORAL | Status: DC
  Administered 2014-01-22 – 2014-01-25 (×4): 20 mg via ORAL
  Filled 2014-01-22 (×4): qty 1

## 2014-01-22 MED ORDER — WARFARIN - PHARMACIST DOSING INPATIENT: Freq: Every day | Status: DC

## 2014-01-22 MED ORDER — ALBUTEROL SULFATE (2.5 MG/3ML) 0.083% IN NEBU: 3.0000 mL | INHALATION_SOLUTION | Freq: Three times a day (TID) | RESPIRATORY_TRACT | Status: DC | PRN

## 2014-01-22 MED ORDER — BIOTENE DRY MOUTH MT LIQD
15.0000 mL | Freq: Two times a day (BID) | OROMUCOSAL | Status: DC
  Administered 2014-01-22 – 2014-01-25 (×7): 15 mL via OROMUCOSAL

## 2014-01-22 MED ORDER — FUROSEMIDE 10 MG/ML IJ SOLN
20.0000 mg | Freq: Once | INTRAMUSCULAR | Status: AC
  Administered 2014-01-22: 20 mg via INTRAVENOUS
  Filled 2014-01-22: qty 2

## 2014-01-22 NOTE — Progress Notes (Signed)
Patient diaphoretic this morning and c/o nausea and feeling weaker. VS obtain, patient temp 97.7 (oral), 98.1 (axillary), BP 149/96. Patient given morning dose of BP medications and cool rag. Patient advised to stay in bed, and only get up with assistance from staff.  Will continue to monitor closely.

## 2014-01-22 NOTE — Progress Notes (Signed)
ANTICOAGULATION CONSULT NOTE - Initial Consult  Pharmacy Consult for warfarin Indication: atrial fibrillation  No Known Allergies  Patient Measurements: Height: 5\' 1"  (154.9 cm) Weight: 166 lb 14.4 oz (75.705 kg) IBW/kg (Calculated) : 47.8   Vital Signs: Temp: 97.7 F (36.5 C) (07/11 1025) Temp src: Oral (07/11 1025) BP: 130/76 mmHg (07/11 1300) Pulse Rate: 90 (07/11 1300)  Labs:  Recent Labs  01/21/14 1505 01/22/14 0425 01/22/14 1102  HGB 7.1* 9.4*  --   HCT 23.4* 30.5*  --   PLT 151 132*  --   LABPROT 37.3*  --  28.7*  INR 3.78*  --  2.70*  CREATININE 0.64 0.68  --     Estimated Creatinine Clearance: 51.4 ml/min (by C-G formula based on Cr of 0.68).   Medical History: Past Medical History  Diagnosis Date  . Myeloma   . COPD (chronic obstructive pulmonary disease)   . Osteopenia   . Hyperlipidemia   . Hypokalemia   . A-fib   . Depression   . PONV (postoperative nausea and vomiting)   . Dysrhythmia   . Shortness of breath     with activity  . Anxiety   . Frequency of urination   . GERD (gastroesophageal reflux disease)   . Arthritis     Assessment: 83 YOF on warfarin PTA for AFib- 4mg  daily. Last dose PTA was 01/16/2014- noted she had an INR of 5.1 on 7/6 and was instructed to hold warfarin. INR on admission was 3.78- she was given RBC, but no vitamin K (this was in MD's plan, but was never ordered). INR this morning is in therapeutic range at 2.7. Hgb 9.4 and plts 132. She is FOBT negative.  Goal of Therapy:  INR 2-3 Monitor platelets by anticoagulation protocol: Yes   Plan:  1. Warfarin 1mg  po x1 tonight 2. Daily PT/INR 3. CBC tomorrow 4. Follow closely for any s/s bleeding  Khaila Velarde D. Daymion Nazaire, PharmD, BCPS Clinical Pharmacist Pager: [phone] 01/22/2014 2:56 PM

## 2014-01-22 NOTE — Progress Notes (Addendum)
[facility] PROGRESS NOTE  Brianna Holden UXN:[id number] DOB: 23-Jan-1931 DOA: 01/21/2014 PCP: [name]  Assessment/Plan: 1-Anemia:Multifactorial- due to Chronic Disease, Multiple Myeloma, and Recent Surgery. FOBT was Negative. S/P one unit of PRBC. HB increase to 9.   2-Hyponatremia; Will DC HCTZ. Improved.   3-A fib: HR up this am. After she received Meds HR in the 90. Continue with digoxin, Cardizem. Coumadin per pharmacy.  Need to be careful with coumadin doses, her INR has been elevated lately.   4-Acute HF susppect diastolic. BNP at 4862, chest x ray with pulmonary edema. Will give another dose of IV lasix. Will order ECHO. Strict I and O, daily weight.  5-Multiple Myeloma; Follow up with [name] for Oncology 6-COPD; started on Nebulizer PRN.  7-GERD; resume PPI>    Code Status: Full Code.  Family Communication: Care discussed with patient and friend who was at bedside.  Disposition Plan: Remain inpatient. Need rehab, SNF.    Consultants:  none  Procedures:  none  Antibiotics:  none  HPI/Subjective: Feeling better than early this am. Nausea is better. No chest pain feels she is breathing better but not at baseline.   Objective: Filed Vitals:   01/22/14 1025  BP: 149/96  Pulse: 112  Temp: 97.7 F (36.5 C)  Resp: 22    Intake/Output Summary (Last 24 hours) at 01/22/14 1244 Last data filed at 01/22/14 1236  Gross per 24 hour  Intake 1061.67 ml  Output   1700 ml  Net -638.33 ml   Filed Weights   01/21/14 2100 01/22/14 0355  Weight: 75.705 kg (166 lb 14.4 oz) 75.705 kg (166 lb 14.4 oz)    Exam:   General:  No distress, Alert.   Cardiovascular: S 1, S 2 RRR tachycardic.   Respiratory: Bilateral crackles.   Abdomen: BS present, soft, NT  Musculoskeletal: no edema.   Data Reviewed: Basic Metabolic Panel:  Recent Labs Lab 01/21/14 1505 01/22/14 0425  NA 127* 129*  K 4.7 4.6  CL 96 97  CO2 17* 19  GLUCOSE 98 100*  BUN 18  21  CREATININE 0.64 0.68  CALCIUM 7.9* 7.8*   Liver Function Tests:  Recent Labs Lab 01/21/14 1505  AST 110*  ALT 59*  ALKPHOS 110  BILITOT 1.5*  PROT 10.0*  ALBUMIN 2.4*   No results found for this basename: LIPASE, AMYLASE,  in the last 168 hours No results found for this basename: AMMONIA,  in the last 168 hours CBC:  Recent Labs Lab 01/21/14 1505 01/22/14 0425  WBC 4.1 4.1  HGB 7.1* 9.4*  HCT 23.4* 30.5*  MCV 92.5 94.1  PLT 151 132*   Cardiac Enzymes: No results found for this basename: CKTOTAL, CKMB, CKMBINDEX, TROPONINI,  in the last 168 hours BNP (last 3 results)  Recent Labs  01/21/14 1505  PROBNP 4862.0*   CBG: No results found for this basename: GLUCAP,  in the last 168 hours  No results found for this or any previous visit (from the past 240 hour(s)).   Studies: Dg Chest 2 View  01/21/2014   CLINICAL DATA:  Hemoptysis.  EXAM: CHEST  2 VIEW  COMPARISON:  Chest x-ray 01/19/2014, 01/06/2014, 02/02/2010.  FINDINGS: Mediastinum and hilar structures are normal. Cardiomegaly with increased pulmonary vascularity and interstitial prominence noted. These findings are consistent with congestive heart failure. No pleural effusion or pneumothorax. Right shoulder replacement. Degenerative changes thoracic spine and left shoulder.  IMPRESSION: 1. Congestive heart failure with pulmonary interstitial edema. 2. Right shoulder  arthroplasty.   Electronically Signed   By: [name]  Register   On: 01/21/2014 19:24    Scheduled Meds: . antiseptic oral rinse  15 mL Mouth Rinse BID  . digoxin  250 mcg Oral Daily  . diltiazem  300 mg Oral Daily  . FLUoxetine  40 mg Oral Daily  . furosemide  20 mg Intravenous Once  . losartan  100 mg Oral Daily  . niacin  1,500 mg Oral QHS  . pantoprazole  20 mg Oral Daily  . pravastatin  40 mg Oral q1800  . sodium chloride  3 mL Intravenous Q12H   Continuous Infusions:   Principal Problem:   Anemia Active Problems:   Myeloma   COPD  (chronic obstructive pulmonary disease)   Hyperlipidemia   A-fib   S/P shoulder replacement   Hyponatremia   SOB (shortness of breath)    Time spent: 25 minutes.     [name] A  [facility] Pager [phone]. If 7PM-7AM, please contact night-coverage at [email or web address], password [facility] 01/22/2014, 12:44 PM  LOS: 1 day

## 2014-01-23 ENCOUNTER — Encounter (HOSPITAL_COMMUNITY): Payer: Self-pay | Admitting: Physician Assistant

## 2014-01-23 DIAGNOSIS — I5031 Acute diastolic (congestive) heart failure: Principal | ICD-10-CM

## 2014-01-23 DIAGNOSIS — I340 Nonrheumatic mitral (valve) insufficiency: Secondary | ICD-10-CM | POA: Diagnosis present

## 2014-01-23 DIAGNOSIS — I350 Nonrheumatic aortic (valve) stenosis: Secondary | ICD-10-CM | POA: Diagnosis present

## 2014-01-23 DIAGNOSIS — I509 Heart failure, unspecified: Secondary | ICD-10-CM

## 2014-01-23 DIAGNOSIS — I359 Nonrheumatic aortic valve disorder, unspecified: Secondary | ICD-10-CM

## 2014-01-23 DIAGNOSIS — R945 Abnormal results of liver function studies: Secondary | ICD-10-CM | POA: Diagnosis present

## 2014-01-23 DIAGNOSIS — R7989 Other specified abnormal findings of blood chemistry: Secondary | ICD-10-CM | POA: Diagnosis present

## 2014-01-23 LAB — BASIC METABOLIC PANEL
Anion gap: 12 (ref 5–15)
BUN: 19 mg/dL (ref 6–23)
CHLORIDE: 95 meq/L — AB (ref 96–112)
CO2: 24 mEq/L (ref 19–32)
Calcium: 7.8 mg/dL — ABNORMAL LOW (ref 8.4–10.5)
Creatinine, Ser: 0.69 mg/dL (ref 0.50–1.10)
GFR calc non Af Amer: 79 mL/min — ABNORMAL LOW (ref >90)
Glucose, Bld: 88 mg/dL (ref 70–99)
POTASSIUM: 3.2 meq/L — AB (ref 3.7–5.3)
SODIUM: 131 meq/L — AB (ref 137–147)

## 2014-01-23 LAB — CBC
HCT: 30.7 % — ABNORMAL LOW (ref 36.0–46.0)
Hemoglobin: 9.5 g/dL — ABNORMAL LOW (ref 12.0–15.0)
MCH: 28.8 pg (ref 26.0–34.0)
MCHC: 30.9 g/dL (ref 30.0–36.0)
MCV: 93 fL (ref 78.0–100.0)
Platelets: 148 10*3/uL — ABNORMAL LOW (ref 150–400)
RBC: 3.3 MIL/uL — ABNORMAL LOW (ref 3.87–5.11)
RDW: 19.4 % — AB (ref 11.5–15.5)
WBC: 4.7 10*3/uL (ref 4.0–10.5)

## 2014-01-23 LAB — PROTIME-INR
INR: 1.93 — AB (ref 0.00–1.49)
Prothrombin Time: 22.1 seconds — ABNORMAL HIGH (ref 11.6–15.2)

## 2014-01-23 MED ORDER — POTASSIUM CHLORIDE CRYS ER 20 MEQ PO TBCR
40.0000 meq | EXTENDED_RELEASE_TABLET | Freq: Once | ORAL | Status: AC
  Administered 2014-01-23: 40 meq via ORAL
  Filled 2014-01-23: qty 2

## 2014-01-23 MED ORDER — WARFARIN SODIUM 1 MG PO TABS
1.0000 mg | ORAL_TABLET | Freq: Once | ORAL | Status: DC
  Filled 2014-01-23: qty 1

## 2014-01-23 MED ORDER — LOSARTAN POTASSIUM 50 MG PO TABS
50.0000 mg | ORAL_TABLET | Freq: Every day | ORAL | Status: DC
  Administered 2014-01-24 – 2014-01-25 (×2): 50 mg via ORAL
  Filled 2014-01-23 (×2): qty 1

## 2014-01-23 MED ORDER — POTASSIUM CHLORIDE CRYS ER 20 MEQ PO TBCR
20.0000 meq | EXTENDED_RELEASE_TABLET | Freq: Every day | ORAL | Status: DC
  Administered 2014-01-24 – 2014-01-25 (×2): 20 meq via ORAL
  Filled 2014-01-23 (×2): qty 1

## 2014-01-23 MED ORDER — ENOXAPARIN SODIUM 40 MG/0.4ML ~~LOC~~ SOLN
40.0000 mg | SUBCUTANEOUS | Status: DC
  Administered 2014-01-23 – 2014-01-24 (×2): 40 mg via SUBCUTANEOUS
  Filled 2014-01-23 (×3): qty 0.4

## 2014-01-23 MED ORDER — FUROSEMIDE 20 MG PO TABS
20.0000 mg | ORAL_TABLET | Freq: Every day | ORAL | Status: DC
  Administered 2014-01-23 – 2014-01-25 (×3): 20 mg via ORAL
  Filled 2014-01-23 (×3): qty 1

## 2014-01-23 NOTE — Consult Note (Signed)
CARDIOLOGY CONSULT NOTE       Patient ID: Brianna Holden MRN: [id number] DOB/AGE: [birth year] 80 y.o.  Admit date: 01/21/2014 Referring Physician:  Tyrell Antonio Primary Physician: [name] Primary Cardiologist:  ? [facility] Reason for Consultation: Aortic Stenosis   Principal Problem:   Anemia Active Problems:   Myeloma   COPD (chronic obstructive pulmonary disease)   Hyperlipidemia   A-fib   S/P shoulder replacement   Hyponatremia   SOB (shortness of breath)   HPI:   Brianna Holden 80 yo admitted with supraRx INR and anemia.  She has been on coumadin for years for afib followed by Dr Brianna Holden in [place].  Very unsteady on feet due to knee pain with failed injections.  [place] a few weeks Ago and had right shoulder replacement by Dr Brianna Holden.  IM consulted for post op hypoxemia and ? CHF.  No echo done and no cardiac w/u.  Has myeloma followed at our cancer center with chronic anemia.  Dr Brianna Holden had been following her Hb/INR as outpatient last week and sent her to hospital for anemia and dyspnea.  History of COPD but non smoker.  BNP 4862 and CXR with CHF.  Echo today read by myself shows normal EF severe As with mean gradient 44 mmHg and peak 78 mmHg.  Mild MR  Patient does not recall being told of murmur and no mention of such on IM post op consult note.  No chest pain or syncope    ROS All other systems reviewed and negative except as noted above  Past Medical History  Diagnosis Date  . Multiple myeloma   . COPD (chronic obstructive pulmonary disease)   . Osteopenia   . Hyperlipidemia   . Hypokalemia   . A-fib   . Depression   . Anxiety   . Frequency of urination   . GERD (gastroesophageal reflux disease)   . Arthritis     Family History  Problem Relation Age of Onset  . Cancer Mother     Unknown Type  . Cancer Father     Oral  . Cancer Son     Brain  . Rheum arthritis Mother     History   Social History  . Marital Status: Divorced    Spouse Name: N/A    Number of  Children: N/A  . Years of Education: N/A   Occupational History  . Not on file.   Social History Main Topics  . Smoking status: Never Smoker   . Smokeless tobacco: Not on file  . Alcohol Use: 1.8 oz/week    3 Cans of beer per week  . Drug Use: No  . Sexual Activity: Not on file   Other Topics Concern  . Not on file   Social History Narrative  . No narrative on file    Past Surgical History  Procedure Laterality Date  . Thyroidectomy, partial    . Tonsillectomy    . Appendectomy    . Reverse shoulder arthroplasty Right 01/06/2014    Procedure: REVERSE RIGHT  SHOULDER ARTHROPLASTY ;  Surgeon: Marin Shutter, MD;  Location: [place];  Service: Orthopedics;  Laterality: Right;     . antiseptic oral rinse  15 mL Mouth Rinse BID  . digoxin  250 mcg Oral Daily  . diltiazem  300 mg Oral Daily  . FLUoxetine  40 mg Oral Daily  . losartan  100 mg Oral Daily  . niacin  1,500 mg Oral QHS  . pantoprazole  20 mg Oral  Daily  . potassium chloride  40 mEq Oral Once  . pravastatin  40 mg Oral q1800  . sodium chloride  3 mL Intravenous Q12H  . warfarin  1 mg Oral ONCE-1800  . Warfarin - Pharmacist Dosing Inpatient   Does not apply q1800      Physical Exam: Blood pressure 101/60, pulse 90, temperature 98.4 F (36.9 C), temperature source Oral, resp. rate 18, height 5' 1"  (1.549 m), weight 161 lb 9.6 oz (73.3 kg), SpO2 100.00%.    Affect appropriate Frail pale elderly female  HEENT: normal Neck supple with no adenopathy JVP normal no bruits no thyromegaly Lungs clear with no wheezing and good diaphragmatic motion Heart:  S1/S2 preserved  AS  murmur, no rub, gallop or click PMI normal Abdomen: benighn, BS positve, no tenderness, no AAA no bruit.  No HSM or HJR Distal pulses intact with no bruits No edema Neuro non-focal Skin warm and dry Arthritic knees and should in sling on right from surgery   Labs:   Lab Results  Component Value Date   WBC 4.7 01/23/2014   HGB 9.5*  01/23/2014   HCT 30.7* 01/23/2014   MCV 93.0 01/23/2014   PLT 148* 01/23/2014    Recent Labs Lab 01/21/14 1505  01/23/14 0425  NA 127*  < > 131*  K 4.7  < > 3.2*  CL 96  < > 95*  CO2 17*  < > 24  BUN 18  < > 19  CREATININE 0.64  < > 0.69  CALCIUM 7.9*  < > 7.8*  PROT 10.0*  --   --   BILITOT 1.5*  --   --   ALKPHOS 110  --   --   ALT 59*  --   --   AST 110*  --   --   GLUCOSE 98  < > 88  < > = values in this interval not displayed.    Radiology: Dg Chest 2 View  01/21/2014   CLINICAL DATA:  Hemoptysis.  EXAM: CHEST  2 VIEW  COMPARISON:  Chest x-ray 01/19/2014, 01/06/2014, 02/02/2010.  FINDINGS: Mediastinum and hilar structures are normal. Cardiomegaly with increased pulmonary vascularity and interstitial prominence noted. These findings are consistent with congestive heart failure. No pleural effusion or pneumothorax. Right shoulder replacement. Degenerative changes thoracic spine and left shoulder.  IMPRESSION: 1. Congestive heart failure with pulmonary interstitial edema. 2. Right shoulder arthroplasty.   Electronically Signed   By: [name]  Register   On: 01/21/2014 19:24   Dg Chest 2 View  01/19/2014   CLINICAL DATA:  Cough and shortness of breath.  EXAM: CHEST  2 VIEW  COMPARISON:  Single view of the chest 01/06/2014.  FINDINGS: There is cardiomegaly and vascular congestion. Aeration is improved since the prior study. No pneumothorax or pleural effusion is identified. Reverse right shoulder arthroplasty is in place. There is extensive lucency about the glenoid component and the superior most screw does not appear anchored in bone.  IMPRESSION: Cardiomegaly and pulmonary vascular congestion without acute disease.  Status post reverse right shoulder arthroplasty. Marked loosening of the glenoid component which appears superiorly displaced is noted.   Electronically Signed   By: Inge Rise M.D.   On: 01/19/2014 16:09   Dg Chest 2 View  01/06/2014   CLINICAL DATA:  Preop for right  shoulder surgery, shortness of breath and history of COPD  EXAM: CHEST  2 VIEW  COMPARISON:  Chest x-ray of 02/02/2010  FINDINGS: There are prominent interstitial  markings diffusely throughout the lungs with cardiomegaly most consistent with mild interstitial edema. No definite effusion is seen. Fracture of the right humeral neck is noted with degenerative change in the left shoulder.  IMPRESSION: Suspect mild interstitial edema.   Electronically Signed   By: Ivar Drape M.D.   On: 01/06/2014 08:14   Dg Chest Port 1 View  01/06/2014   CLINICAL DATA:  Shortness of breath  EXAM: PORTABLE CHEST - 1 VIEW  COMPARISON:  Film from earlier in the same day  FINDINGS: Cardiac shadow remains enlarged. The degree of vascular congestion has increased significantly in the interval from the prior exam. New right basilar atelectatic changes are seen. No acute bony abnormality is noted. Postsurgical changes in the right shoulder are now seen.  IMPRESSION: Increasing congestive failure with associated right basilar atelectasis.  These results were called by telephone at the time of interpretation on 01/06/2014 at 10:56 AM to Amy the patients nurse, who verbally acknowledged these results.   Electronically Signed   By: Inez Catalina M.D.   On: 01/06/2014 10:57    EKG:  afib with LVH    ASSESSMENT AND PLAN:  Aortic Stenosis:  Dyspnea is multifactorial with COPD, anemia and CHF.  She does not appear to be a candidate for open AVR.  May be a candidate to be evaluated in TAVR clinic at some point.  For now Needs daily lasix.  Decrease ARB with fixed outflow obstruction and AS to prevent hypotension.  Does not need invasive w/u at this time Afib:  D/C digoxin continue cardizem for rate control  With falls, age, myeloma, low PLT's and severe anemia would not restart coumadin.   Anemia:  guaic negative  Transfuse with lasix  Related to age, myeloma consider inpatient GI w/u for AVM's  Ortho:  Per Dr Brianna Holden will need  PT/OT Myeloma:  F/u [facility] [name]  Continue revlimid   She may benefit from SNF on d/c   Signed: Jenkins Rouge 01/23/2014, 2:38 PM

## 2014-01-23 NOTE — Evaluation (Signed)
Physical Therapy Evaluation Patient Details Name: Brianna Holden MRN: [id number] DOB: 1930/12/31 Today's Date: 01/23/2014   History of Present Illness  Pt admitted for SOB and anemia.  She underwent a R reverse TSA 01/06/14.  Clinical Impression  Pt admitted with SOB and anemia. Pt currently with functional limitations due to the deficits listed below (see PT Problem List). She has been receiving HHPT following her R reverse TSA.  Her friend assists her with ambulation and all ADLs. Pt will benefit from skilled PT to increase their independence and safety with mobility to allow discharge to the venue listed below. Pt and caregiver are in agreement that SNF needed for continued therapies upon d/c.      Follow Up Recommendations SNF;Supervision/Assistance - 24 hour    Equipment Recommendations  None recommended by PT    Recommendations for Other Services       Precautions / Restrictions Precautions Precautions: Shoulder;Fall Shoulder Interventions: Shoulder sling/immobilizer;At all times;Off for dressing/bathing/exercises Required Braces or Orthoses: Sling Restrictions Weight Bearing Restrictions: No RUE Weight Bearing: Non weight bearing      Mobility  Bed Mobility Overal bed mobility: Needs Assistance Bed Mobility: Supine to Sit;Sit to Supine     Supine to sit: Min assist Sit to supine: Min assist      Transfers Overall transfer level: Needs assistance Equipment used: 1 person hand held assist Transfers: Sit to/from [facility] Sit to Stand: Min assist Stand pivot transfers: Min assist       General transfer comment: retropulsive with initial stance  Ambulation/Gait Ambulation/Gait assistance: Min assist Ambulation Distance (Feet): 15 Feet Assistive device: 1 person hand held assist Gait Pattern/deviations: Step-through pattern;Decreased stride length Gait velocity: decreased   General Gait Details: unsteady gait  Stairs             Wheelchair Mobility    Modified Rankin (Stroke Patients Only)       Balance                                             Pertinent Vitals/Pain     Home Living Family/patient expects to be discharged to:: Private residence Living Arrangements: Non-relatives/Friends Available Help at Discharge: Available 24 hours/day Type of Home: House       Home Layout: One level Home Equipment: Kasandra Knudsen - single point      Prior Function Level of Independence: Needs assistance   Gait / Transfers Assistance Needed: pt's friend provides HHA during ambulation.  ADL's / Homemaking Assistance Needed: assist needed for ADLs  Comments: Prior to fall (01-05-14), pt was independent with ADLs and mobility.     Hand Dominance        Extremity/Trunk Assessment                         Communication   Communication: No difficulties  Cognition Arousal/Alertness: Awake/alert Behavior During Therapy: WFL for tasks assessed/performed Overall Cognitive Status: Within Functional Limits for tasks assessed       Memory: Decreased short-term memory              General Comments      Exercises        Assessment/Plan    PT Assessment Patient needs continued PT services  PT Diagnosis Difficulty walking;Acute pain   PT Problem List Decreased strength;Decreased activity tolerance;Decreased balance;Decreased mobility;Decreased knowledge  of use of DME;Decreased knowledge of precautions;Decreased safety awareness;Pain  PT Treatment Interventions DME instruction;Gait training;Stair training;Functional mobility training;Therapeutic activities;Therapeutic exercise;Balance training;Patient/family education   PT Goals (Current goals can be found in the Care Plan section) Acute Rehab PT Goals Patient Stated Goal: home PT Goal Formulation: With patient Time For Goal Achievement: 02/06/14 Potential to Achieve Goals: Good    Frequency Min 3X/week   Barriers to  discharge        Co-evaluation               End of Session Equipment Utilized During Treatment: Gait belt Activity Tolerance: Patient limited by fatigue Patient left: in bed;with family/visitor present;with call bell/phone within reach Nurse Communication: Mobility status         Time: 5573-2202 PT Time Calculation (min): 23 min   Charges:   PT Evaluation $Initial PT Evaluation Tier I: 1 Procedure PT Treatments $Gait Training: 8-22 mins   PT G Codes:          Lorriane Shire 01/23/2014, 2:23 PM  Lorrin Goodell, PT  Office # [phone] Pager [phone]

## 2014-01-23 NOTE — Progress Notes (Signed)
[facility] PROGRESS NOTE  Brianna Holden BHA:[id number] DOB: [birth year] DOA: 01/21/2014 PCP: [name]  Assessment/Plan: 1-Anemia:Multifactorial- due to Chronic Disease, Multiple Myeloma. [name] FOBT was Negative. S/P one unit of PRBC. HB increase to 9. Hb stable. Out patient GI work up when more stable.   2-Hyponatremia; Will DC HCTZ. Improved. Replete K.   3-A fib: Continue with Cardizem. Digoxin discontinue by cardio. Will discontinue coumadin as recommended by cardio. Will need DVT prophylaxis.   4-Acute Diastolic Heart Failure:  BNP at 4862, chest x ray with pulmonary edema. Received of IV lasix.  ECHO with Severe aortic stenosis. Strict I and O, daily weight. Cardio consulted. Cozaar decrease to avoid hypotension.   S/P shoulder replacement: will inform [name] of patient admission.   Aortic stenosis: Cardiology consulted.   Multiple Myeloma; Follow up with [name] for Oncology  -COPD; started on Nebulizer PRN.   -GERD; Continue with  PPI>    Code Status: Full Code.  Family Communication: Care discussed with patient and friend who was at bedside.  Disposition Plan: Remain inpatient. Need rehab, SNF.    Consultants:  Cardiology.   Procedures:  none  Antibiotics:  none  HPI/Subjective: She is breathing better. Still with right shoulder pain.   Objective: Filed Vitals:   01/23/14 1339  BP: 101/60  Pulse: 90  Temp: 98.4 F (36.9 C)  Resp: 18    Intake/Output Summary (Last 24 hours) at 01/23/14 1524 Last data filed at 01/23/14 1300  Gross per 24 hour  Intake    720 ml  Output   1000 ml  Net   -280 ml   Filed Weights   01/21/14 2100 01/22/14 0355 01/23/14 0500  Weight: 75.705 kg (166 lb 14.4 oz) 75.705 kg (166 lb 14.4 oz) 73.3 kg (161 lb 9.6 oz)    Exam:   General:  No distress, Alert.   Cardiovascular: S 1, S 2 RRR tachycardic. Positive murmur.   Respiratory: less crackles, bilateral good air movement.   Abdomen: BS present,  soft, NT  Musculoskeletal: no edema.   Data Reviewed: Basic Metabolic Panel:  Recent Labs Lab 01/21/14 1505 01/22/14 0425 01/23/14 0425  NA 127* 129* 131*  K 4.7 4.6 3.2*  CL 96 97 95*  CO2 17* 19 24  GLUCOSE 98 100* 88  BUN _0 CREATININE 0.64 0.68 0.69  CALCIUM 7.9* 7.8* 7.8*   Liver Function Tests:  Recent Labs Lab 01/21/14 1505  AST 110*  ALT 59*  ALKPHOS 110  BILITOT 1.5*  PROT 10.0*  ALBUMIN 2.4*   No results found for this basename: LIPASE, AMYLASE,  in the last 168 hours No results found for this basename: AMMONIA,  in the last 168 hours CBC:  Recent Labs Lab 01/21/14 1505 01/22/14 0425 01/23/14 0425  WBC 4.1 4.1 4.7  HGB 7.1* 9.4* 9.5*  HCT 23.4* 30.5* 30.7*  MCV 92.5 94.1 93.0  PLT 151 132* 148*   Cardiac Enzymes: No results found for this basename: CKTOTAL, CKMB, CKMBINDEX, TROPONINI,  in the last 168 hours BNP (last 3 results)  Recent Labs  01/21/14 1505  PROBNP 4862.0*   CBG: No results found for this basename: GLUCAP,  in the last 168 hours  No results found for this or any previous visit (from the past 240 hour(s)).   Studies: Dg Chest 2 View  01/21/2014   CLINICAL DATA:  Hemoptysis.  EXAM: CHEST  2 VIEW  COMPARISON:  Chest x-ray 01/19/2014, 01/06/2014, 02/02/2010.  FINDINGS:  Mediastinum and hilar structures are normal. Cardiomegaly with increased pulmonary vascularity and interstitial prominence noted. These findings are consistent with congestive heart failure. No pleural effusion or pneumothorax. Right shoulder replacement. Degenerative changes thoracic spine and left shoulder.  IMPRESSION: 1. Congestive heart failure with pulmonary interstitial edema. 2. Right shoulder arthroplasty.   Electronically Signed   By: [name]  Register   On: 01/21/2014 19:24    Scheduled Meds: . antiseptic oral rinse  15 mL Mouth Rinse BID  . diltiazem  300 mg Oral Daily  . FLUoxetine  40 mg Oral Daily  . furosemide  20 mg Oral Daily  . [START  ON 01/24/2014] losartan  50 mg Oral Daily  . niacin  1,500 mg Oral QHS  . pantoprazole  20 mg Oral Daily  . potassium chloride  20 mEq Oral Daily  . pravastatin  40 mg Oral q1800  . sodium chloride  3 mL Intravenous Q12H  . Warfarin - Pharmacist Dosing Inpatient   Does not apply q1800   Continuous Infusions:   Principal Problem:   Anemia Active Problems:   Myeloma   COPD (chronic obstructive pulmonary disease)   Hyperlipidemia   A-fib   S/P shoulder replacement   Hyponatremia   SOB (shortness of breath)   Acute diastolic CHF (congestive heart failure)   Severe aortic stenosis   Moderate mitral regurgitation   Abnormal LFTs    Time spent: 25 minutes.     [name] A  [facility] Pager [phone]. If 7PM-7AM, please contact night-coverage at [email or web address], password [facility] 01/23/2014, 3:24 PM  LOS: 2 days

## 2014-01-23 NOTE — Consult Note (Signed)
Cardiology Consultation Note  Patient ID: Brianna Holden, MRN: [id number], DOB/AGE: 14-Jun-1931 80 y.o. Admit date: 01/21/2014   Date of Consult: 01/23/2014 Primary Physician: [name] Primary Cardiologist: New  Chief Complaint: SOB Reason for Consult: severe AS, wall motion abnormality, diastolic CHF  HPI: Brianna Holden is an 80 y/o F with history of atrial fib managed by her PCP, multiple myeloma, GERD, COPD, moderate AS by echo 04/2013 who presented to [facility] with progressive dyspnea and recent unevaluated anemia. We are asked to see her for severe AS on echo this admission. Hgb 11->7 range prior to any surgical intervention this year. She has had SOB for about a year requiring her to stop and rest, worse in the last 2 weeks.  She is not incredibly informed of her medical history but thinks she's had AF for several years - no prior cardioversion, believes she's in it all the time. She denies any history of CAD or anginal workup. (Prior echo via [place] at [facility] 04/2013: LVH, EF 12-75%, diastolic LV dysfunction, mild MR, dilated LA, moderate AS/TR, moderate pulm HTN, mild PR,mild right ventricular dysfunction.) She suffered a fall in May 2015 after her knees went weak after stepping over her dog. She fell on her shoulder, which eventually required surgery. She denies any overt blood loss at that time. Hgb usually used to run 11-13 range - was 11.3 on 11/10/13. At her pre-operative evaluation before any surgical intervention on 12/29/13 Hgb was 7.5, and per phone note "Message left with Pollyann Savoy @ Dr. Susie Cassette office that pt.'s Hgb. Was 7.5." It is unclear if she had any interim intervention but returned for right shoulder reverse arthroplasty on 01/06/14 at which time Hgb was 7.9. Her Hgb was 7.8 on 6/30, down to 7.1 on day of admission 01/21/14. She has had worsening SOB for the last year, more pronounced over the last 10 days. She apparently saw her PCP last week and "2 meds  were stopped" but unclear which ones. No chest pain, syncope, vomiting, stroke. Her friend thinks she's had a "rattling" noise when she sleeps at night but the patient denies any orthopnea. +Chronic unchanged LEE. INR was 3.78 on admission, reportedly on hold since 7/6 when INR was 5.1 per H&P. She received 2.5m vitamin K, 1 u PRBC. Due to hyponatremia 127, HCTZ was held but treated with IV Lasix. 2D echo this admission (01/23/14): mild LVH, EF 55-60%, posterior basal hypokinesis, severe AS (0.42cm2 VTI, 0.53cm2 Vmax), moderate MR, severely dilated LA, mildly dilated RA, PA pressure 463mg. FOBT negative in ED. LFTs abnormal - albumin 2,4 AST 110, ALT 59, Tprot 10.0, Tbili 1.5. CXR - CHF with pulmonary interstitial edema. She does report intermittent dizziness at home and recently frequent nausea. She does report recently discolored urine, ie tea colored.   Past Medical History  Diagnosis Date  . Multiple myeloma   . COPD (chronic obstructive pulmonary disease)   . Osteopenia   . Hyperlipidemia   . Hypokalemia   . A-fib   . Depression   . Anxiety   . Frequency of urination   . GERD (gastroesophageal reflux disease)   . Arthritis       Most Recent Cardiac Studies: 2D Echo 01/2014 - Left ventricle: Posterior basal hypokinesis. The cavity size was normal. Wall thickness was increased in a pattern of mild LVH. Systolic function was normal. The estimated ejection fraction was in the range of 55% to 60%.  Aortic valve: Severely calcified leaflets. Doppler: There was  severe stenosis. Valve area (VTI): 0.42 cm^2. Indexed valve area (VTI): 0.23 cm^2/m^2. Valve area (Vmax): 0.53 cm^2. Indexed valve area (Vmax): 0.29 cm^2/m^2. Mean gradient (S): 44 mm Hg. Peak gradient (S): 78 mm Hg. - Mitral valve: Calcified annulus. There was moderate regurgitation. - Left atrium: The atrium was severely dilated. - Right atrium: The atrium was mildly dilated. - Atrial septum: No defect or patent foramen ovale  was identified. - Pulmonary arteries: PA peak pressure: 44 mm Hg (S).   2D echo 04/2013 Final Echocardiography Report-Transthoracic Study Date: 04/21/2013 Patient Information Patient: IZZABELL, KLASEN FKC:[id number]- Address: [address] Zip: [place] [address] Telephone: [phone] Date of Birth: June 10, 1931 Sex: Female Sonographer: Reuel Derby MD: Referring MD: Heath Gold Interpreting MD: Marjean Donna Baseline Data Age: 37 Height: 157 cm Heart Rate: 86 Weight: 80.3 kg Rhythm: NSR BSA: 1.81 BP Resting: 130/75 Inpatient: Outpatient Scheduling Type: Routine Study Data Case Number: [id number] [place] RoomNumber: OP Date of Test: 10/8/2014Study End Time: 02:51 PM Indication(s): Shortness of breath Congestive heart failure Edema CPT Code Description [address]00 [place] [address] Resting transthoracic echocardiography with continous and pulsed wave doppler and color flow imaging complete Quality of Study: Good Measurements Normal Values Normal Values LV 4. 3.5 - 5.6 Fractional 24 28 - 42% Diastole 11 cm Shortening .3 LV Systole 3. 2.2 - 4.3 Aortic Root 3. 2.0 - 3.7 11 cm 7 cm LV Post. 1. 0.7 - 1.2 Left Atrium 5. 1.9 - 4.0 Wall 21 cm 8 cm Diastole LV IVS 1. 0.7 - 1.2 Right 1.0 - 2.6 Diastole 43 cm Ventricle cm Thickness LV IVS 0.7 - 1.2 Ejection 50-55 53% - 80% Systole cm Fraction Thickness (Rest) LV Mass Doppler Measurements Aortic Mitral Tricuspid Pulmonic Peak Velocity (msec): 3.5 3.2 1 Peak Est.Gradient 48 (mmHg): Mean Est. Gradient 30 (mmHg): Valve Area (cm2): Estimated PA Systolic: Pressure 1 Time: LVOT Diameter (cm): LVOT Mean Gradient (mmHg): LVOT Peak Gradient (mmHg): Interpretation: Clinical Diagnoses and Echocardiographic Findings Left ventricular hypertrophy Decreased left ventricular ejection fraction (92-33%) Diastolic left ventricular dysfunction Elevated left ventricular filling pressures Mitral annular  calcification Mitral regurgitation (mild) Dilated left atrium Aortic stenosis (moderate - see detail below) Pulmonary hypertension (moderate - see detail below) Pulmonary regurgitation (mild) Dilated right ventricle Contractile right ventricular dysfunction (mild) Tricuspid regurgitation (moderate) Elevated central venous and right atrial pressures (see detail below) Dilated right atrium Descriptive Comments - Left Ventricle The left ventricle is normal in size with mildly increased wall thickness and low normal contraction. The visually estimated left ventricular ejection fraction is 50-55%. Diastolic transmitral flow profile and mitral annular tissue Doppler signal characteristic of impaired left ventricular relaxation with elevated filling pressures. Mitral Valve There is annular calcification and the mitral leaflets are mildly thickened with normal mobility. There is mild mitral regurgitation by color flow and continuous wave Doppler examinations. Left Atrium The left atrium is moderately dilated. Aortic Valve The aortic valve is trileaflet and thickened with moderately limited excursion. There is no echocardiographic evidence of aortic regurgitation by color flow or continuous wave Doppler examinations. The LVOT/AoV maximal velocity and TVI ratios are 0.29 and 0.25 respectively with peak instantaneous transvalvular jet velocity of approximately 3.5 m/s. The estimated peak instantaneous aortic valvular pressure gradient is 51 mm Hg. The estimated mean aortic valvular pressure gradient is 30 mm Hg. Aorta The thoracic aorta is upper normal in diameter. Pulmonary Artery The pulmonary artery is normal in diameter. Pulmonic Valve The pulmonary valve is normal. There is mild pulmonary regurgitation  by color flow and continuous wave Doppler imaging. There is no evidence of hemodynamically important pulmonary transvalvular gradient; the maximal instantaneous right  ventricular outflow velocity is approximately 1.0 m/s. Right Ventricle The right ventricle is mildly dilated with normal wall thickness and mildly decreased contraction. Tricuspid Valve The tricuspid valve is structurally normal. There is moderate tricuspid regurgitation by color flow and continuous wave Doppler imaging. The peak instantaneous tricuspid regurgitant jet velocity is 3.2 m/s characteristic of moderately elevated right ventricular systolic pressure (06-23 mm Hg). Right Atrium The right atrium is mildly dilated. Inferior Vena Cava The inferior vena cava is dilated with physiological phasic respiratory change characteristic of mildly elevated central venous and right atrial pressures; the estimated central venous and right atrial pressures are 10-15 mm Hg. Pericardium There is no evidence of pericardial effusion.   Surgical History:  Past Surgical History  Procedure Laterality Date  . Thyroidectomy, partial    . Tonsillectomy    . Appendectomy    . Reverse shoulder arthroplasty Right 01/06/2014    Procedure: REVERSE RIGHT  SHOULDER ARTHROPLASTY ;  Surgeon: Marin Shutter, MD;  Location: [place];  Service: Orthopedics;  Laterality: Right;     Home Meds: Prior to Admission medications   Medication Sig Start Date End Date Taking? Authorizing Provider  albuterol (PROVENTIL HFA;VENTOLIN HFA) 108 (90 BASE) MCG/ACT inhaler Inhale 2 puffs into the lungs 3 (three) times daily as needed for wheezing or shortness of breath.    Yes Historical Provider, MD  digoxin (LANOXIN) 0.25 MG tablet Take 250 mcg by mouth daily.     Yes Historical Provider, MD  diltiazem (CARDIZEM CD) 300 MG 24 hr capsule Take 300 mg by mouth daily.     Yes Historical Provider, MD  FLUoxetine (PROZAC) 40 MG capsule Take 40 mg by mouth daily.     Yes Historical Provider, MD  Hypromellose (GENTEAL OP) Place 1 drop into both eyes daily as needed (scratchy eyes).   Yes Historical Provider, MD  lenalidomide  (REVLIMID) 15 MG capsule Take 1 capsule (15 mg total) by mouth See admin instructions. Take for 21 days, hold for 7 days and then repeat 01/19/14  Yes Wyatt Portela, MD  loperamide (IMODIUM) 2 MG capsule Take 2 mg by mouth 4 (four) times daily as needed for diarrhea or loose stools.    Yes Historical Provider, MD  niacin (NIASPAN) 500 MG CR tablet Take 1,500 mg by mouth at bedtime.    Yes Historical Provider, MD  omeprazole (PRILOSEC OTC) 20 MG tablet Take 20 mg by mouth daily.   Yes Historical Provider, MD  oxyCODONE-acetaminophen (PERCOCET/ROXICET) 5-325 MG per tablet Take 1 tablet by mouth every 4 (four) hours as needed (pain).   Yes Historical Provider, MD  potassium chloride SA (K-DUR,KLOR-CON) 20 MEQ tablet Take 60 mEq by mouth at bedtime.   Yes Historical Provider, MD  pravastatin (PRAVACHOL) 40 MG tablet Take 40 mg by mouth at bedtime.    Yes Historical Provider, MD  losartan-hydrochlorothiazide (HYZAAR) 100-25 MG per tablet Take 1 tablet by mouth See admin instructions. Take 1 tablet by mouth daily after BP goes back up to 140/90    Historical Provider, MD  warfarin (COUMADIN) 4 MG tablet Take 1 tablet (4 mg total) by mouth daily at 6 PM. 01/08/14   Jenetta Loges, PA-C    Inpatient Medications:  . antiseptic oral rinse  15 mL Mouth Rinse BID  . digoxin  250 mcg Oral Daily  . diltiazem  300 mg  Oral Daily  . FLUoxetine  40 mg Oral Daily  . losartan  100 mg Oral Daily  . niacin  1,500 mg Oral QHS  . pantoprazole  20 mg Oral Daily  . potassium chloride  40 mEq Oral Once  . pravastatin  40 mg Oral q1800  . sodium chloride  3 mL Intravenous Q12H  . warfarin  1 mg Oral ONCE-1800  . Warfarin - Pharmacist Dosing Inpatient   Does not apply q1800      Allergies: No Known Allergies  History   Social History  . Marital Status: Divorced    Spouse Name: N/A    Number of Children: N/A  . Years of Education: N/A   Occupational History  . Not on file.   Social History Main Topics  .  Smoking status: Never Smoker   . Smokeless tobacco: Not on file  . Alcohol Use: 1.8 oz/week    3 Cans of beer per week  . Drug Use: No  . Sexual Activity: Not on file   Other Topics Concern  . Not on file   Social History Narrative  . No narrative on file     Family History  Problem Relation Age of Onset  . Cancer Mother     Unknown Type  . Cancer Father     Oral  . Cancer Son     Brain  . Rheum arthritis Mother      Review of Systems: General: negative for chills, fever Cardiovascular: see above. Unaware of palpitations Dermatological: negative for rash Respiratory: negative for cough or wheezing Urologic: negative for hematuria Abdominal: negative for vomiting, diarrhea, bright red blood per rectum, melena, or hematemesis Neurologic: negative for visual changes, syncope All other systems reviewed and are otherwise negative except as noted above.  Labs: POC  Lab Results  Component Value Date   WBC 4.7 01/23/2014   HGB 9.5* 01/23/2014   HCT 30.7* 01/23/2014   MCV 93.0 01/23/2014   PLT 148* 01/23/2014    Recent Labs Lab 01/21/14 1505  01/23/14 0425  NA 127*  < > 131*  K 4.7  < > 3.2*  CL 96  < > 95*  CO2 17*  < > 24  BUN 18  < > 19  CREATININE 0.64  < > 0.69  CALCIUM 7.9*  < > 7.8*  PROT 10.0*  --   --   BILITOT 1.5*  --   --   ALKPHOS 110  --   --   ALT 59*  --   --   AST 110*  --   --   GLUCOSE 98  < > 88  < > = values in this interval not displayed.  Radiology/Studies:  Dg Chest 2 View  01/21/2014   CLINICAL DATA:  Hemoptysis.  EXAM: CHEST  2 VIEW  COMPARISON:  Chest x-ray 01/19/2014, 01/06/2014, 02/02/2010.  FINDINGS: Mediastinum and hilar structures are normal. Cardiomegaly with increased pulmonary vascularity and interstitial prominence noted. These findings are consistent with congestive heart failure. No pleural effusion or pneumothorax. Right shoulder replacement. Degenerative changes thoracic spine and left shoulder.  IMPRESSION: 1. Congestive  heart failure with pulmonary interstitial edema. 2. Right shoulder arthroplasty.   Electronically Signed   By: [name]  Register   On: 01/21/2014 19:24   Dg Chest 2 View  01/19/2014   CLINICAL DATA:  Cough and shortness of breath.  EXAM: CHEST  2 VIEW  COMPARISON:  Single view of the chest 01/06/2014.  FINDINGS: There is  cardiomegaly and vascular congestion. Aeration is improved since the prior study. No pneumothorax or pleural effusion is identified. Reverse right shoulder arthroplasty is in place. There is extensive lucency about the glenoid component and the superior most screw does not appear anchored in bone.  IMPRESSION: Cardiomegaly and pulmonary vascular congestion without acute disease.  Status post reverse right shoulder arthroplasty. Marked loosening of the glenoid component which appears superiorly displaced is noted.   Electronically Signed   By: Inge Rise M.D.   On: 01/19/2014 16:09   Dg Chest 2 View  01/06/2014   CLINICAL DATA:  Preop for right shoulder surgery, shortness of breath and history of COPD  EXAM: CHEST  2 VIEW  COMPARISON:  Chest x-ray of 02/02/2010  FINDINGS: There are prominent interstitial markings diffusely throughout the lungs with cardiomegaly most consistent with mild interstitial edema. No definite effusion is seen. Fracture of the right humeral neck is noted with degenerative change in the left shoulder.  IMPRESSION: Suspect mild interstitial edema.   Electronically Signed   By: Ivar Drape M.D.   On: 01/06/2014 08:14   Dg Chest Port 1 View  01/06/2014   CLINICAL DATA:  Shortness of breath  EXAM: PORTABLE CHEST - 1 VIEW  COMPARISON:  Film from earlier in the same day  FINDINGS: Cardiac shadow remains enlarged. The degree of vascular congestion has increased significantly in the interval from the prior exam. New right basilar atelectatic changes are seen. No acute bony abnormality is noted. Postsurgical changes in the right shoulder are now seen.  IMPRESSION:  Increasing congestive failure with associated right basilar atelectasis.  These results were called by telephone at the time of interpretation on 01/06/2014 at 10:56 AM to Amy the patients nurse, who verbally acknowledged these results.   Electronically Signed   By: Inez Catalina M.D.   On: 01/06/2014 10:57    EKG: atrial fibrillation 96bpm, nonspecific ST-T changes  Physical Exam: Blood pressure 101/60, pulse 90, temperature 98.4 F (36.9 C), temperature source Oral, resp. rate 18, height 5' 1" (1.549 m), weight 161 lb 9.6 oz (73.3 kg), SpO2 100.00%. General: Well developed, well nourished WF in no acute distress. Head: Normocephalic, atraumatic, sclera non-icteric, no xanthomas, nares are without discharge.  Neck: JVD not elevated. Lungs: Clear bilaterally to auscultation without wheezes, rales, or rhonchi. Breathing is unlabored. Heart: Irreg irreg with S1. Soft SEM RUSB, difficult to appreciate S2. No rubs or gallops appreciated. Abdomen: Soft, non-tender, non-distended with normoactive bowel sounds. No hepatomegaly. No rebound/guarding. No obvious abdominal masses. Msk:  Strength and tone appear normal for age. Extremities: No clubbing or cyanosis. No significant edema. Distal pedal pulses are 2+ and equal bilaterally. Neuro: Alert and oriented X 3 but generally a poor historian with details of her medical care - gets sidetracked easily. No facial asymmetry. No focal deficit. Moves all extremities spontaneously. Psych:  Pleasant affect. Pt consented to having friend in room during interview.   Assessment and Plan:   1. Recent acute anemia, etiology unclear, tr Hgb on UA, - FOBTx1 2. Acute diastolic CHF, improved - +wma with posterior basal hypokinesis 3. Severe AS/moderate MR 4. Multiple myeloma 5. Hyponatremia/hypokalemia, managed per IM 6. Abnormal LFTs 7. Recent fall requiring shoulder arthroplasty 01/06/14 8. Atrial fibrillation, suspect chronic   Will discuss further workup with  MD. Not clear that she would be a candidate for traditional AVR. She has multiple comorbidities. Needs further workup of anemia per IM - this preceded her surgery, but was present after  her fall. See below for further thoughts.   Signed, Melina Copa PA-C 01/23/2014, 2:33 PM    Seem my separate consult note

## 2014-01-23 NOTE — Progress Notes (Signed)
Echo Lab  2D Echocardiogram completed.  Hakeem Frazzini L Markesia Crilly, RDCS 01/23/2014 9:25 AM

## 2014-01-23 NOTE — Progress Notes (Addendum)
ADDENDUM Cardiology opted to stop warfarin in setting of age, risk of falls, anemia and myeloma. To just be on Lovenox for VTE prophlyaxis. BMI <30, CrCl >38mL/min. Will start Lovenox 40mg  subQ q24h. Pharmacy to sign off.  Lavern Crimi D. Jona Zappone, PharmD, BCPS Clinical Pharmacist Pager: [phone] 01/23/2014 3:58 PM   ANTICOAGULATION CONSULT NOTE - Follow Up Consult  Pharmacy Consult for warfarin Indication: atrial fibrillation  No Known Allergies  Patient Measurements: Height: 5\' 1"  (154.9 cm) Weight: 161 lb 9.6 oz (73.3 kg) IBW/kg (Calculated) : 47.8   Vital Signs: Temp: 98 F (36.7 C) (07/12 0554) Temp src: Oral (07/12 0554) BP: 102/67 mmHg (07/12 1017) Pulse Rate: 92 (07/12 1017)  Labs:  Recent Labs  01/21/14 1505 01/22/14 0425 01/22/14 1102 01/23/14 0425  HGB 7.1* 9.4*  --  9.5*  HCT 23.4* 30.5*  --  30.7*  PLT 151 132*  --  148*  LABPROT 37.3*  --  28.7* 22.1*  INR 3.78*  --  2.70* 1.93*  CREATININE 0.64 0.68  --  0.69    Estimated Creatinine Clearance: 50.5 ml/min (by C-G formula based on Cr of 0.69).   Assessment: 83 YOF on warfarin PTA for AFib- 4mg  daily. Last dose PTA was 01/16/2014- noted she had an INR of 5.1 on 7/6 and was instructed to hold warfarin on that day as well as when her INR was rechecked on 7/8 and 7/10 as an outpatient. INR on admission was 3.78- she was given RBC, but no vitamin K (this was in MD's plan, but was never ordered). Warfarin resumed yesterday when INR was 2.7- she received 1mg  last evening. INR this morning 1.93- not fully reflective of last evening's dose as it takes 24 hours to see full effects. Hgb and plts stable. No bleeding.  Goal of Therapy:  INR 2-3 Monitor platelets by anticoagulation protocol: Yes   Plan:  1. Warfarin 1mg  po x1 tonight 2. Daily PT/INR 3. Follow closely for any s/s bleeding  Arkeem Harts D. Drayton Tieu, PharmD, BCPS Clinical Pharmacist Pager: [phone] 01/23/2014 12:06 PM

## 2014-01-24 LAB — BASIC METABOLIC PANEL
ANION GAP: 9 (ref 5–15)
BUN: 17 mg/dL (ref 6–23)
CALCIUM: 8 mg/dL — AB (ref 8.4–10.5)
CO2: 25 mEq/L (ref 19–32)
CREATININE: 0.63 mg/dL (ref 0.50–1.10)
Chloride: 97 mEq/L (ref 96–112)
GFR calc Af Amer: >90 mL/min (ref >90)
GFR calc non Af Amer: 82 mL/min — ABNORMAL LOW (ref >90)
Glucose, Bld: 104 mg/dL — ABNORMAL HIGH (ref 70–99)
Potassium: 4.1 mEq/L (ref 3.7–5.3)
Sodium: 131 mEq/L — ABNORMAL LOW (ref 137–147)

## 2014-01-24 LAB — CBC
HCT: 31 % — ABNORMAL LOW (ref 36.0–46.0)
Hemoglobin: 9.3 g/dL — ABNORMAL LOW (ref 12.0–15.0)
MCH: 28.5 pg (ref 26.0–34.0)
MCHC: 30 g/dL (ref 30.0–36.0)
MCV: 95.1 fL (ref 78.0–100.0)
Platelets: 159 10*3/uL (ref 150–400)
RBC: 3.26 MIL/uL — ABNORMAL LOW (ref 3.87–5.11)
RDW: 19.5 % — ABNORMAL HIGH (ref 11.5–15.5)
WBC: 5.4 10*3/uL (ref 4.0–10.5)

## 2014-01-24 NOTE — Evaluation (Signed)
Occupational Therapy Evaluation Patient Details Name: Brianna Holden MRN: [id number] DOB: 1931/06/14 Today's Date: 01/24/2014    History of Present Illness Pt admitted for SOB and anemia.  She underwent a R reverse TSA 01/06/14.    Clinical Impression   PT admitted with anemia. Pt currently with functional limitiations due to the deficits listed below (see OT problem list). Pt with recent reverse TSA Rt shoulder.  Pt will benefit from skilled OT to increase their independence and safety with adls and balance to allow discharge SNF. Pt is POD 18 from Reverse TSA and remains excellent SNF rehab candidate at this time. Pt needs constant reinforcement for NWB status.     Follow Up Recommendations  SNF    Equipment Recommendations  Other (comment) (defer SNF)    Recommendations for Other Services       Precautions / Restrictions Precautions Precautions: Shoulder;Fall Type of Shoulder Precautions: No pendulums. following limits of [name]: 30 ER; 45 abd; 90 FF. PROM Shoulder Interventions: Shoulder sling/immobilizer;At all times;Off for dressing/bathing/exercises Precaution Booklet Issued: Yes (comment) Precaution Comments: handout reviewed and education with patient and roommate [place] with teach back return demo Required Braces or Orthoses: Sling Restrictions RUE Weight Bearing: Non weight bearing      Mobility Bed Mobility Overal bed mobility: Needs Assistance Bed Mobility: Sit to Supine       Sit to supine: Min guard   General bed mobility comments: needed cues to prevent rolling onto right shoulder  Transfers Overall transfer level: Needs assistance Equipment used: 1 person hand held assist Transfers: Sit to/from Stand Sit to Stand: Min assist Stand pivot transfers: Min assist       General transfer comment: need (A) to prevent furniture walking and RT UE NWB    Balance Overall balance assessment: Needs assistance;History of Falls Sitting-balance support: Single  extremity supported;Feet supported Sitting balance-Leahy Scale: Fair     Standing balance support: Single extremity supported;During functional activity Standing balance-Leahy Scale: Poor                              ADL Overall ADL's : Needs assistance/impaired                         Toilet Transfer: Minimal assistance;Ambulation;BSC Toilet Transfer Details (indicate cue type and reason): attempting to use RT hand to weight bear and furniture walk  [place] and Hygiene: Maximal assistance;Sit to/from stand Toileting - Clothing Manipulation Details (indicate cue type and reason): pt using Rt UE     Functional mobility during ADLs: Minimal assistance General ADL Comments: Pt with no awareness to deficits or precautions. pt reeducated on exercises and provided new handout. Pt performed exercises.     Vision                     Perception     Praxis      Pertinent Vitals/Pain VSS     Hand Dominance Right   Extremity/Trunk Assessment Upper Extremity Assessment Upper Extremity Assessment: RUE deficits/detail RUE Deficits / Details: reverse TSA- supple protocol - AAROM   Lower Extremity Assessment Lower Extremity Assessment: Defer to PT evaluation   Cervical / Trunk Assessment Cervical / Trunk Assessment: Kyphotic   Communication Communication Communication: No difficulties   Cognition Arousal/Alertness: Awake/alert Behavior During Therapy: WFL for tasks assessed/performed Overall Cognitive Status: Impaired/Different from baseline Area of Impairment: Memory  Memory: Decreased short-term memory;Decreased recall of precautions         General Comments: Pt with no recall of exercises or NWB status   General Comments       Exercises   Other Exercises Other Exercises: attempted R shoulder PROM within following [name] limitations: ER 30; Abd 45; FF 90. Other Exercises: AAROM : shoulder ff 80 degrees,  abduction 45 degrees and er 30 degrees   Shoulder Instructions      Home Living Family/patient expects to be discharged to:: Skilled nursing facility                                        Prior Functioning/Environment Level of Independence: Needs assistance    ADL's / Homemaking Assistance Needed: Pt actively using Rt UE daily for all adls pta- per patient   Comments: Prior to fall (01-05-14), pt was independent with ADLs and mobility.    OT Diagnosis: Generalized weakness;Cognitive deficits;Acute pain   OT Problem List: Decreased strength;Decreased range of motion;Impaired balance (sitting and/or standing);Decreased activity tolerance;Decreased safety awareness;Decreased knowledge of use of DME or AE;Decreased knowledge of precautions;Pain;Impaired UE functional use   OT Treatment/Interventions: Self-care/ADL training;Therapeutic exercise;DME and/or AE instruction;Therapeutic activities;Patient/family education;Balance training    OT Goals(Current goals can be found in the care plan section) Acute Rehab OT Goals Patient Stated Goal: to go to rehab OT Goal Formulation: With patient/family Time For Goal Achievement: 02/07/14 Potential to Achieve Goals: Good  OT Frequency: Min 2X/week   Barriers to D/C: Decreased caregiver support          Co-evaluation              End of Session Nurse Communication: Mobility status;Precautions  Activity Tolerance: Patient tolerated treatment well Patient left: in bed;with call bell/phone within reach;with family/visitor present   Time: 7741-2878 OT Time Calculation (min): 29 min Charges:  OT General Charges $OT Visit: 1 Procedure OT Evaluation $Initial OT Evaluation Tier I: 1 Procedure OT Treatments $Self Care/Home Management : 8-22 mins $Therapeutic Exercise: 8-22 mins G-Codes:    Parke Poisson B 2014-02-09, 4:26 PM Pager: [phone]

## 2014-01-24 NOTE — Care Management Note (Signed)
    Page 1 of 1   01/25/2014     3:01:27 PM CARE MANAGEMENT NOTE 01/25/2014  Patient:  Brianna Holden, Brianna Holden   Account Number:  [id number]  Date Initiated:  01/24/2014  Documentation initiated by:  Bocephus Cali  Subjective/Objective Assessment:   Pt adm on 01/21/14 with CHF.  Recent fall with shoulder fracture.  Pt active with [facility] prior to admission for [facility], PT, OT.     Action/Plan:   Pt will need SNF at dc, per PT/OT recommendations.  CSW consulted to facilitate dc to SNF when medically stable for dc   Anticipated DC Date:  01/25/2014   Anticipated DC Plan:  SKILLED NURSING FACILITY  In-house referral  Clinical Social Worker      DC Planning Services  CM consult      Choice offered to / List presented to:             Status of service:  Completed, signed off Medicare Important Message given?  YES (If response is "NO", the following Medicare IM given date fields will be blank) Date Medicare IM given:  01/24/2014 Medicare IM given by:  Jiyaan Steinhauser Date Additional Medicare IM given:   Additional Medicare IM given by:    Discharge Disposition:  [place]  Per UR Regulation:  Reviewed for med. necessity/level of care/duration of stay  If discussed at [place] of Stay Meetings, dates discussed:    Comments:  01/25/14 Ellan Lambert, RN, BSN [phone] Pt discharged to SNF today, per CSW arrangements.

## 2014-01-24 NOTE — Progress Notes (Signed)
CSW provided bed offers to patient. Patient was not that happy with her choices and would like social worker to follow up with other facilities. CSW continues to search for a bed.   Jeanette Caprice, MSW, [place]

## 2014-01-24 NOTE — Progress Notes (Signed)
Clinical Social Work Department BRIEF PSYCHOSOCIAL ASSESSMENT 01/24/2014  Patient:  Brianna Holden, Brianna Holden     Account Number:  [id number]     Admit date:  01/21/2014  Clinical Social Worker:  Megan Salon  Date/Time:  01/24/2014 10:50 AM  Referred by:  Physician  Date Referred:  01/24/2014 Referred for  SNF Placement   Other Referral:   Interview type:  Patient Other interview type:    PSYCHOSOCIAL DATA Living Status:  ALONE Admitted from facility:   Level of care:   Primary support name:  [place] Primary support relationship to patient:  FRIEND Degree of support available:   Okay    CURRENT CONCERNS Current Concerns  Post-Acute Placement   Other Concerns:    SOCIAL WORK ASSESSMENT / PLAN Clinical Social Worker received referral for SNF placement at d/c. Clinical Social Worker met with patient at bedside to offer support and discuss patient needs at discharge.  CSW introduced self and explained reason for visit. CSW explained SNF process to patient.  Patient reported she is agreeable for SNF placement and prefers [facility]. CSW encouraged patient to think about additional SNF options pending availability of preferred facility. CSW will complete FL2 for MD's signature and will update patient when bed offers are received.    CSW remains available for support and to facilitate patient discharge needs once medically ready.   Assessment/plan status:  Psychosocial Support/Ongoing Assessment of Needs Other assessment/ plan:   Information/referral to community resources:   SNF information    PATIENT'S/FAMILY'S RESPONSE TO PLAN OF CARE: Patient states she would like [facility] at DC.        Jeanette Caprice, MSW, [place]

## 2014-01-24 NOTE — Progress Notes (Signed)
Received a phone call notifying us of Brianna Holden's admission for exacerbation of her CHF. I had seen her for her first post op appointment and her shoulder looked great. I will go ahead and order OT for her while she is inpatient. I hope to get by to check on her tomorrow. Phone message pending to [place].

## 2014-01-24 NOTE — Progress Notes (Signed)
Clinical Social Work Department CLINICAL SOCIAL WORK PLACEMENT NOTE 01/24/2014  Patient:  Brianna Holden, Brianna Holden  Account Number:  [id number] Admit date:  01/21/2014  Clinical Social Worker:  Megan Salon  Date/time:  01/24/2014 09:08 AM  Clinical Social Work is seeking post-discharge placement for this patient at the following level of care:   [place]   (*CSW will update this form in Epic as items are completed)   01/24/2014  Patient/family provided with [place] Department of Clinical Social Work's list of facilities offering this level of care within the geographic area requested by the patient (or if unable, by the patient's family).  01/24/2014  Patient/family informed of their freedom to choose among providers that offer the needed level of care, that participate in Medicare, Medicaid or managed care program needed by the patient, have an available bed and are willing to accept the patient.  01/24/2014  Patient/family informed of [facility]' ownership interest in [facility], as well as of the fact that they are under no obligation to receive care at this facility.  PASARR submitted to EDS on 01/24/2014 PASARR number received on   FL2 transmitted to all facilities in geographic area requested by pt/family on  01/24/2014 FL2 transmitted to all facilities within larger geographic area on   Patient informed that his/her managed care company has contracts with or will negotiate with  certain facilities, including the following:     Patient/family informed of bed offers received:   Patient chooses bed at  Physician recommends and patient chooses bed at    Patient to be transferred to  on   Patient to be transferred to facility by  Patient and family notified of transfer on  Name of family member notified:    The following physician request were entered in Epic:   Additional Comments:  Jeanette Caprice, MSW, [place]

## 2014-01-24 NOTE — Progress Notes (Addendum)
[facility] PROGRESS NOTE  Brianna Holden WUG:[id number] DOB: 1930/07/29 DOA: 01/21/2014 PCP: [name]  Assessment/Plan: 1-Anemia:Multifactorial- due to Chronic Disease, Multiple Myeloma. [name] FOBT was Negative. S/P one unit of PRBC. HB increase to 9. Hb stable. Out patient GI work up when more stable. Hb stable.   2-Hyponatremia; Will DC HCTZ. Improved. Replete K.   3-A fib: Continue with Cardizem. Digoxin discontinue by cardio. Will discontinue coumadin as recommended by cardio. Will need DVT prophylaxis.   4-Acute Diastolic Heart Failure:  BNP at 4862, chest x ray with pulmonary edema. Received of IV lasix.  ECHO with Severe aortic stenosis.  Cardio consulted. Cardio adjusted medications. Cozaar decrease to avoid hypotension. Negative 1 L. Weight 75 ---73. On Lasix 20 mg daily.   S/P shoulder replacement: will inform [name] of patient admission.   Aortic stenosis: Cardiology consulted.   Multiple Myeloma; Follow up with [name] for Oncology  -COPD; started on Nebulizer PRN.   -GERD; Continue with  PPI>    Code Status: Full Code.  Family Communication: Care discussed with patient and friend who was at bedside.  Disposition Plan: SNF 7-14 if ok by cardio   Consultants:  Cardiology.   Procedures:  none  Antibiotics:  none  HPI/Subjective: She is feeling much better, breathing better.   Objective: Filed Vitals:   01/24/14 0912  BP: 115/77  Pulse: 78  Temp:   Resp: 18    Intake/Output Summary (Last 24 hours) at 01/24/14 1332 Last data filed at 01/24/14 1300  Gross per 24 hour  Intake    480 ml  Output    700 ml  Net   -220 ml   Filed Weights   01/22/14 0355 01/23/14 0500 01/24/14 1039  Weight: 75.705 kg (166 lb 14.4 oz) 73.3 kg (161 lb 9.6 oz) 73.8 kg (162 lb 11.2 oz)    Exam:   General:  No distress, Alert.   Cardiovascular: S 1, S 2 RRR tachycardic. Positive murmur.   Respiratory: less crackles, bilateral good air movement.    Abdomen: BS present, soft, NT  Musculoskeletal: no edema.   Data Reviewed: Basic Metabolic Panel:  Recent Labs Lab 01/21/14 1505 01/22/14 0425 01/23/14 0425 01/24/14 0500  NA 127* 129* 131* 131*  K 4.7 4.6 3.2* 4.1  CL 96 97 95* 97  CO2 17* _0 GLUCOSE 98 100* 88 104*  BUN _1 CREATININE 0.64 0.68 0.69 0.63  CALCIUM 7.9* 7.8* 7.8* 8.0*   Liver Function Tests:  Recent Labs Lab 01/21/14 1505  AST 110*  ALT 59*  ALKPHOS 110  BILITOT 1.5*  PROT 10.0*  ALBUMIN 2.4*   No results found for this basename: LIPASE, AMYLASE,  in the last 168 hours No results found for this basename: AMMONIA,  in the last 168 hours CBC:  Recent Labs Lab 01/21/14 1505 01/22/14 0425 01/23/14 0425 01/24/14 0500  WBC 4.1 4.1 4.7 5.4  HGB 7.1* 9.4* 9.5* 9.3*  HCT 23.4* 30.5* 30.7* 31.0*  MCV 92.5 94.1 93.0 95.1  PLT 151 132* 148* 159   Cardiac Enzymes: No results found for this basename: CKTOTAL, CKMB, CKMBINDEX, TROPONINI,  in the last 168 hours BNP (last 3 results)  Recent Labs  01/21/14 1505  PROBNP 4862.0*   CBG: No results found for this basename: GLUCAP,  in the last 168 hours  No results found for this or any previous visit (from the past 240 hour(s)).   Studies: No results found.  Scheduled Meds: . antiseptic oral rinse  15 mL Mouth Rinse BID  . diltiazem  300 mg Oral Daily  . enoxaparin (LOVENOX) injection  40 mg Subcutaneous Q24H  . FLUoxetine  40 mg Oral Daily  . furosemide  20 mg Oral Daily  . losartan  50 mg Oral Daily  . niacin  1,500 mg Oral QHS  . pantoprazole  20 mg Oral Daily  . potassium chloride  20 mEq Oral Daily  . pravastatin  40 mg Oral q1800  . sodium chloride  3 mL Intravenous Q12H   Continuous Infusions:   Principal Problem:   Anemia Active Problems:   Myeloma   COPD (chronic obstructive pulmonary disease)   Hyperlipidemia   A-fib   S/P shoulder replacement   Hyponatremia   SOB (shortness of breath)   Acute  diastolic CHF (congestive heart failure)   Severe aortic stenosis   Moderate mitral regurgitation   Abnormal LFTs    Time spent: 25 minutes.     [name] A  [facility] Pager [phone]. If 7PM-7AM, please contact night-coverage at [email or web address], password [facility] 01/24/2014, 1:32 PM  LOS: 3 days

## 2014-01-24 NOTE — Progress Notes (Signed)
OT NOTE  [name]: Can you please advise when patient can begin to engage in advance shoulder exercises since surgery is now post op 01/06/14 ( POD 18). Please write new order with details. In addition, this documentation could help with SNF admission. Thank you in advance   Jeri Modena   OTR/L Pager: [phone] Office: [phone] .

## 2014-01-25 DIAGNOSIS — I359 Nonrheumatic aortic valve disorder, unspecified: Secondary | ICD-10-CM

## 2014-01-25 LAB — BASIC METABOLIC PANEL
ANION GAP: 8 (ref 5–15)
BUN: 15 mg/dL (ref 6–23)
CO2: 26 mEq/L (ref 19–32)
Calcium: 8 mg/dL — ABNORMAL LOW (ref 8.4–10.5)
Chloride: 98 mEq/L (ref 96–112)
Creatinine, Ser: 0.56 mg/dL (ref 0.50–1.10)
GFR calc non Af Amer: 85 mL/min — ABNORMAL LOW (ref >90)
Glucose, Bld: 94 mg/dL (ref 70–99)
Potassium: 4.4 mEq/L (ref 3.7–5.3)
Sodium: 132 mEq/L — ABNORMAL LOW (ref 137–147)

## 2014-01-25 MED ORDER — POTASSIUM CHLORIDE CRYS ER 20 MEQ PO TBCR: 20.0000 meq | EXTENDED_RELEASE_TABLET | Freq: Every day | ORAL | Status: AC

## 2014-01-25 MED ORDER — LOSARTAN POTASSIUM 50 MG PO TABS: 50.0000 mg | ORAL_TABLET | Freq: Every day | ORAL | Status: AC

## 2014-01-25 MED ORDER — OXYCODONE-ACETAMINOPHEN 5-325 MG PO TABS: 1.0000 | ORAL_TABLET | ORAL | Status: AC | PRN

## 2014-01-25 MED ORDER — FUROSEMIDE 20 MG PO TABS: 20.0000 mg | ORAL_TABLET | Freq: Every day | ORAL | Status: AC

## 2014-01-25 NOTE — Discharge Summary (Signed)
Physician Discharge Summary  Brianna Holden AVW:[id number] DOB: 01-24-1931 DOA: 01/21/2014  PCP: [name]  Admit date: 01/21/2014 Discharge date: 01/25/2014  Time spent: 35 minutes  Recommendations for Outpatient Follow-up:  1. Needs to follow up with [name] after shoulder surgery.  2. Needs to follow up with Dr Burt Knack for further evaluation of aortic valve stenosis.  3. Need GI evaluation for anemia.  4. Needs B-met to follow renal function.   Discharge Diagnoses:    Acute diastolic CHF (congestive heart failure)   Severe aortic stenosis   Anemia   Myeloma   COPD (chronic obstructive pulmonary disease)   Hyperlipidemia   A-fib   S/P shoulder replacement   Hyponatremia   SOB (shortness of breath)   Moderate mitral regurgitation   Abnormal LFTs   Discharge Condition: stable.   Diet recommendation: Heart Healthy  Filed Weights   01/23/14 0500 01/24/14 1039 01/25/14 0547  Weight: 73.3 kg (161 lb 9.6 oz) 73.8 kg (162 lb 11.2 oz) 74.299 kg (163 lb 12.8 oz)    History of present illness:  Brianna Holden is a 80 y.o. female with a history of Multiple Myeloma, Atrial Fibrillation on coumadin Rx, and Anemia of Chronic Disease, who present tot he ED with complaints of worsening SOB over the past 10 days. She denies fevers or chills or chest pain, She had a right Shoulder Reverse Arthroplasty performed on 06/25. Her hemoglobin level was 8.2 on 07/06 and today on re-check in the Ed was 7.1 her FOBT was Heme Negative. And her INR was also elevated at 3.78, and has been on hold since 07/06 when her INR was 5.1    Hospital Course:Patient admitted with dyspnea thought to be multifactorial secondary to Heart Failure exacerbation, anemia, aortic stenosis.   1-Anemia:Multifactorial- due to Chronic Disease, Multiple Myeloma. [name] FOBT was Negative. S/P one unit of PRBC. HB increase to 9. Hb stable. Out patient GI work up when more stable. Hb stable.   2-Hyponatremia; Will DC HCTZ.  Improved. Repleted K.   3-A fib: Continue with Cardizem. Digoxin discontinue by cardio. Will discontinue coumadin as recommended by cardio.  4-Acute Diastolic Heart Failure: BNP at 4862, chest x ray with pulmonary edema. Received of IV lasix. ECHO with Severe aortic stenosis. Cardio consulted. Cardio adjusted medications. Cozaar decrease to avoid hypotension. Negative 1.4 L. Weight 75 ---73--75 . On Lasix 20 mg daily. Breathing better.   S/P shoulder replacement: will inform [name] of patient admission. OT ordered by ortho.   Aortic stenosis: Cardiology consulted. Follow up outpatient with Dr Burt Knack for further discussion for treatment.   Multiple Myeloma; Needs to follow up with [name].Discussed with  [name] ok to hold Revlimid. He will make arrangement for outpatient follow up.   -COPD; started on Nebulizer PRN.   -GERD; Continue with PPI>    Procedures: ECHO; Left ventricle: Posterior basal hypokinesis. The cavity size was normal. Wall thickness was increased in a pattern of mild LVH. Systolic function was normal. The estimated ejection fraction was in the range of 55% to 60%. - Aortic valve: There was severe stenosis. Valve area (VTI): 0.42 cm^2. Valve area (Vmax): 0.53 cm^2. - Mitral valve: Calcified annulus. There was moderate regurgitation. - Left atrium: The atrium was severely dilated. - Right atrium: The atrium was mildly dilated. - Atrial septum: No defect or patent foramen ovale was identified. - Pulmonary arteries: PA peak pressure: 44 mm Hg (S).    Consultations:  Cardiology  Discharge Exam: Danley Danker  Vitals:   01/25/14 0547  BP: 91/62  Pulse: 70  Temp: 98.5 F (36.9 C)  Resp: 16    General: no distress.  Cardiovascular: S 1, S 2 RRR Respiratory: CTA  Discharge Instructions You were cared for by a hospitalist during your hospital stay. If you have any questions about your discharge medications or the care you received while you were in the  hospital after you are discharged, you can call the unit and asked to speak with the hospitalist on call if the hospitalist that took care of you is not available. Once you are discharged, your primary care physician will handle any further medical issues. Please note that NO REFILLS for any discharge medications will be authorized once you are discharged, as it is imperative that you return to your primary care physician (or establish a relationship with a primary care physician if you do not have one) for your aftercare needs so that they can reassess your need for medications and monitor your lab values.  Discharge Instructions   Diet - low sodium heart healthy    Complete by:  As directed      Increase activity slowly    Complete by:  As directed             Medication List    STOP taking these medications       digoxin 0.25 MG tablet  Commonly known as:  LANOXIN     losartan-hydrochlorothiazide 100-25 MG per tablet  Commonly known as:  HYZAAR     warfarin 4 MG tablet  Commonly known as:  COUMADIN      TAKE these medications       albuterol 108 (90 BASE) MCG/ACT inhaler  Commonly known as:  PROVENTIL HFA;VENTOLIN HFA  Inhale 2 puffs into the lungs 3 (three) times daily as needed for wheezing or shortness of breath.     diltiazem 300 MG 24 hr capsule  Commonly known as:  CARDIZEM CD  Take 300 mg by mouth daily.     FLUoxetine 40 MG capsule  Commonly known as:  PROZAC  Take 40 mg by mouth daily.     furosemide 20 MG tablet  Commonly known as:  LASIX  Take 1 tablet (20 mg total) by mouth daily.     GENTEAL OP  Place 1 drop into both eyes daily as needed (scratchy eyes).     lenalidomide 15 MG capsule  Commonly known as:  REVLIMID  Take 1 capsule (15 mg total) by mouth See admin instructions. Take for 21 days, hold for 7 days and then repeat     loperamide 2 MG capsule  Commonly known as:  IMODIUM  Take 2 mg by mouth 4 (four) times daily as needed for diarrhea or  loose stools.     losartan 50 MG tablet  Commonly known as:  COZAAR  Take 1 tablet (50 mg total) by mouth daily.     niacin 500 MG CR tablet  Commonly known as:  NIASPAN  Take 1,500 mg by mouth at bedtime.     omeprazole 20 MG tablet  Commonly known as:  PRILOSEC OTC  Take 20 mg by mouth daily.     oxyCODONE-acetaminophen 5-325 MG per tablet  Commonly known as:  PERCOCET/ROXICET  Take 1 tablet by mouth every 4 (four) hours as needed (pain).     potassium chloride SA 20 MEQ tablet  Commonly known as:  K-DUR,KLOR-CON  Take 1 tablet (20 mEq total) by mouth  daily.     pravastatin 40 MG tablet  Commonly known as:  PRAVACHOL  Take 40 mg by mouth at bedtime.       No Known Allergies     Follow-up Information   Follow up with Marin Shutter, MD In 2 weeks.   Specialty:  Orthopedic Surgery   Contact information:   [address] [place] [address] [phone]       Follow up with Jenkins Rouge, MD In 2 weeks.   Specialty:  Cardiology   Contact information:   [address] [place] [address] [phone]        The results of significant diagnostics from this hospitalization (including imaging, microbiology, ancillary and laboratory) are listed below for reference.    Significant Diagnostic Studies: Dg Chest 2 View  01/21/2014   CLINICAL DATA:  Hemoptysis.  EXAM: CHEST  2 VIEW  COMPARISON:  Chest x-ray 01/19/2014, 01/06/2014, 02/02/2010.  FINDINGS: Mediastinum and hilar structures are normal. Cardiomegaly with increased pulmonary vascularity and interstitial prominence noted. These findings are consistent with congestive heart failure. No pleural effusion or pneumothorax. Right shoulder replacement. Degenerative changes thoracic spine and left shoulder.  IMPRESSION: 1. Congestive heart failure with pulmonary interstitial edema. 2. Right shoulder arthroplasty.   Electronically Signed   By: [name]  Register   On: 01/21/2014 19:24    Dg Chest 2 View  01/19/2014   CLINICAL DATA:  Cough and shortness of breath.  EXAM: CHEST  2 VIEW  COMPARISON:  Single view of the chest 01/06/2014.  FINDINGS: There is cardiomegaly and vascular congestion. Aeration is improved since the prior study. No pneumothorax or pleural effusion is identified. Reverse right shoulder arthroplasty is in place. There is extensive lucency about the glenoid component and the superior most screw does not appear anchored in bone.  IMPRESSION: Cardiomegaly and pulmonary vascular congestion without acute disease.  Status post reverse right shoulder arthroplasty. Marked loosening of the glenoid component which appears superiorly displaced is noted.   Electronically Signed   By: Inge Rise M.D.   On: 01/19/2014 16:09   Dg Chest 2 View  01/06/2014   CLINICAL DATA:  Preop for right shoulder surgery, shortness of breath and history of COPD  EXAM: CHEST  2 VIEW  COMPARISON:  Chest x-ray of 02/02/2010  FINDINGS: There are prominent interstitial markings diffusely throughout the lungs with cardiomegaly most consistent with mild interstitial edema. No definite effusion is seen. Fracture of the right humeral neck is noted with degenerative change in the left shoulder.  IMPRESSION: Suspect mild interstitial edema.   Electronically Signed   By: Ivar Drape M.D.   On: 01/06/2014 08:14   Dg Chest Port 1 View  01/06/2014   CLINICAL DATA:  Shortness of breath  EXAM: PORTABLE CHEST - 1 VIEW  COMPARISON:  Film from earlier in the same day  FINDINGS: Cardiac shadow remains enlarged. The degree of vascular congestion has increased significantly in the interval from the prior exam. New right basilar atelectatic changes are seen. No acute bony abnormality is noted. Postsurgical changes in the right shoulder are now seen.  IMPRESSION: Increasing congestive failure with associated right basilar atelectasis.  These results were called by telephone at the time of interpretation on 01/06/2014 at  10:56 AM to Amy the patients nurse, who verbally acknowledged these results.   Electronically Signed   By: Inez Catalina M.D.   On: 01/06/2014 10:57    Microbiology: No results found for this or  any previous visit (from the past 240 hour(s)).   Labs: Basic Metabolic Panel:  Recent Labs Lab 01/21/14 1505 01/22/14 0425 01/23/14 0425 01/24/14 0500 01/25/14 0410  NA 127* 129* 131* 131* 132*  K 4.7 4.6 3.2* 4.1 4.4  CL 96 97 95* 97 98  CO2 17* 19 24 25 26   GLUCOSE 98 100* 88 104* 94  BUN 18 21 19 17 15   CREATININE 0.64 0.68 0.69 0.63 0.56  CALCIUM 7.9* 7.8* 7.8* 8.0* 8.0*   Liver Function Tests:  Recent Labs Lab 01/21/14 1505  AST 110*  ALT 59*  ALKPHOS 110  BILITOT 1.5*  PROT 10.0*  ALBUMIN 2.4*   No results found for this basename: LIPASE, AMYLASE,  in the last 168 hours No results found for this basename: AMMONIA,  in the last 168 hours CBC:  Recent Labs Lab 01/21/14 1505 01/22/14 0425 01/23/14 0425 01/24/14 0500  WBC 4.1 4.1 4.7 5.4  HGB 7.1* 9.4* 9.5* 9.3*  HCT 23.4* 30.5* 30.7* 31.0*  MCV 92.5 94.1 93.0 95.1  PLT 151 132* 148* 159   Cardiac Enzymes: No results found for this basename: CKTOTAL, CKMB, CKMBINDEX, TROPONINI,  in the last 168 hours BNP: BNP (last 3 results)  Recent Labs  01/21/14 1505  PROBNP 4862.0*   CBG: No results found for this basename: GLUCAP,  in the last 168 hours     Signed:  [name] A  [facility] 01/25/2014, 11:26 AM

## 2014-01-25 NOTE — Progress Notes (Addendum)
CSW received insurance auth. Auth number [id number]. Clinical Social Worker facilitated patient discharge including contacting patient, patient's friend and facility to confirm patient discharge plans.  Clinical information faxed to facility and patient agreeable with plan.  Patient states her friend is going to bring her to [facility].  RN to call report prior to discharge.  Clinical Social Worker will sign off for now as social work intervention is no longer needed. Please consult [place] again if new need arises.  Jeanette Caprice, MSW, [place]

## 2014-01-25 NOTE — Progress Notes (Addendum)
Clinical Social Work Department CLINICAL SOCIAL WORK PLACEMENT NOTE 01/25/2014  Patient:  ARLY, SALMINEN  Account Number:  [id number] Admit date:  01/21/2014  Clinical Social Worker:  Megan Salon  Date/time:  01/24/2014 09:08 AM  Clinical Social Work is seeking post-discharge placement for this patient at the following level of care:   [place]   (*CSW will update this form in Epic as items are completed)   01/24/2014  Patient/family provided with [place] Department of Clinical Social Work's list of facilities offering this level of care within the geographic area requested by the patient (or if unable, by the patient's family).  01/24/2014  Patient/family informed of their freedom to choose among providers that offer the needed level of care, that participate in Medicare, Medicaid or managed care program needed by the patient, have an available bed and are willing to accept the patient.  01/24/2014  Patient/family informed of [facility]' ownership interest in [facility], as well as of the fact that they are under no obligation to receive care at this facility.  PASARR submitted to EDS on 01/24/2014 PASARR number received on 01/25/2014  FL2 transmitted to all facilities in geographic area requested by pt/family on  01/24/2014 FL2 transmitted to all facilities within larger geographic area on   Patient informed that his/her managed care company has contracts with or will negotiate with  certain facilities, including the following:     Patient/family informed of bed offers received:  01/25/2014 Patient chooses bed at Other Physician recommends and patient chooses bed at    Patient to be transferred to Other on  01/25/2014 Patient to be transferred to facility by FRIEND Patient and family notified of transfer on 01/25/2014 Name of family member notified:  FRIEND  The following physician request were entered in Epic:   Additional  Comments:  Jeanette Caprice, MSW, [place]

## 2014-01-25 NOTE — Progress Notes (Signed)
Occupational Therapy Treatment Patient Details Name: Brianna Holden MRN: [id number] DOB: Apr 27, 1931 Today's Date: 01/25/2014    History of present illness Pt admitted for SOB and anemia.  She underwent a R reverse TSA 01/06/14.    OT comments  Pt requires continued education on RT UE exercises and sling positioning. Pt found with sling improperly don again this session and unable to correct with questioning cues.    Follow Up Recommendations  SNF    Equipment Recommendations  Other (comment)    Recommendations for Other Services      Precautions / Restrictions Precautions Precautions: Shoulder;Fall Type of Shoulder Precautions: No pendulums. following limits of [name]: 30 ER; 45 abd; 90 FF. PROM Shoulder Interventions: Shoulder sling/immobilizer;At all times;Off for dressing/bathing/exercises Precaution Booklet Issued: Yes (comment) Precaution Comments: handout reviewed and education with patient and roommate Brianna Holden with teach back return demo Required Braces or Orthoses: Sling Restrictions Weight Bearing Restrictions: No RUE Weight Bearing: Non weight bearing       Mobility Bed Mobility                  Transfers Overall transfer level: Needs assistance Equipment used: 1 person hand held assist Transfers: Sit to/from Stand Sit to Stand: Min assist              Balance Overall balance assessment: Needs assistance Sitting-balance support: Single extremity supported;Feet supported Sitting balance-Leahy Scale: Fair     Standing balance support: Single extremity supported;During functional activity Standing balance-Leahy Scale: Poor                     ADL Overall ADL's : Needs assistance/impaired     Grooming: Oral care;Minimal assistance Grooming Details (indicate cue type and reason): Pt attempting to brush teeth with RT UE and educated to use LT UE until MD Supple recommends AAROM                             Functional mobility  during ADLs: Moderate assistance General ADL Comments: Pt reaching for environmental support with all mobility. Pt very unstable and high risk for falling. pt with no recall of shoulder exercises.       Vision                     Perception     Praxis      Cognition   Behavior During Therapy: [facility] for tasks assessed/performed Overall Cognitive Status: Impaired/Different from baseline Area of Impairment: Memory     Memory: Decreased short-term memory;Decreased recall of precautions          General Comments: pt unable to recall education from 01/24/14 session. Pt requires continued reinforcement. Pt and roommate Brianna Holden report pt could don sling at home. Pt asked to place sling on this session. Pt placed elbow sling on shoulder and says "how do I wrap this around?"     Extremity/Trunk Assessment               Exercises Other Exercises Other Exercises: AAROM : shoulder ff 80 degrees, abduction 45 degrees and er 30 degrees   Shoulder Instructions       General Comments      Pertinent Vitals/ Pain       VSS  Home Living  Prior Functioning/Environment              Frequency Min 2X/week     Progress Toward Goals  OT Goals(current goals can now be found in the care plan section)  Progress towards OT goals: Progressing toward goals  Acute Rehab OT Goals Patient Stated Goal: to go to rehab OT Goal Formulation: With patient/family Time For Goal Achievement: 02/07/14 Potential to Achieve Goals: Good ADL Goals Pt Will Perform Grooming: with set-up;sitting Pt Will Perform Upper Body Bathing: with set-up;sitting Pt Will Transfer to Toilet: with set-up;bedside commode Pt/caregiver will Perform Home Exercise Program: Right Upper extremity;With Supervision;With written HEP provided Additional ADL Goal #1: Pt/fmaily will independently manage sling, positioniong of R UE and assist with ADL to  facilitate safe D/C home.  Plan Discharge plan remains appropriate    Co-evaluation                 End of Session     Activity Tolerance Patient tolerated treatment well   Patient Left in bed;with call bell/phone within reach;with family/visitor present   Nurse Communication Mobility status;Precautions        Time: 0076-2263 OT Time Calculation (min): 25 min  Charges: OT General Charges $OT Visit: 1 Procedure OT Treatments $Therapeutic Exercise: 23-37 mins  Parke Poisson B 01/25/2014, 2:49 PM Pager: [phone]

## 2014-01-25 NOTE — Progress Notes (Signed)
Pt/family given discharge instructions, medication lists, follow up appointments, and when to call the doctor.  Pt/family verbalizes understanding. Pt being transported to facility by friend. Francee Gentile [place], [place]

## 2014-01-25 NOTE — Consult Note (Addendum)
CARDIOLOGY CONSULT NOTE  Patient ID: Brianna Holden, MRN: [id number], DOB/AGE: [birth year] 78 y.o. Admit date: 01/21/2014 Date of Consult: 01/25/2014  Primary Physician: [name] Primary Cardiologist: Dr Johnsie Cancel Referring Physician: same  Chief Complaint: Shortness of Breath Reason for Consultation: Severe aortic stenosis - TAVR Evaluation  HPI: Asked to evaluate this 80 year-old woman for consideration of treatment options for severe symptomatic aortic stenosis.  The patient lives with a longtime friend in [place], [place]. She is divorced and her children are deceased. She has been treated for Multiple Myeloma since 2010. Initially treated with Revlimide but this was discontinued in 2013. She fell and fractured her right humerus and ultimately underwent right shoulder arthroplasty January 06, 2014. She has been maintained on long-term warfarin for atrial fibrillation. Her warfarin was discontinued recently in the setting of anemia and  excessive fall risk.  The patient was hospitalized July 10 with progressive weakness, shortness of breath, and anemia. She notes that these symptoms have occurred over the past few weeks. However, she's been progressively weak now ever since injuring her shoulder in May. She has occasional chest pain. She reports gait unsteadiness and weakness in her knees, but other than the fall leading to her shoulder displacement she does not report additional falls. She denies frank syncope. She admits to chronic shortness of breath. During this hospitalization she has been diagnosed with congestive heart failure with evidence of pulmonary edema on chest x-ray and elevated BNP. Echocardiography demonstrated severe aortic stenosis and I was asked to see her for further evaluation and treatment options.    Past Medical History  Diagnosis Date  . Multiple myeloma   . COPD (chronic obstructive pulmonary disease)   . Osteopenia   . Hyperlipidemia   . Hypokalemia   .  A-fib   . Depression   . Anxiety   . Frequency of urination   . GERD (gastroesophageal reflux disease)   . Arthritis       Surgical History:  Past Surgical History  Procedure Laterality Date  . Thyroidectomy, partial    . Tonsillectomy    . Appendectomy    . Reverse shoulder arthroplasty Right 01/06/2014    Procedure: REVERSE RIGHT  SHOULDER ARTHROPLASTY ;  Surgeon: Marin Shutter, MD;  Location: [facility];  Service: Orthopedics;  Laterality: Right;     Home Meds: Prior to Admission medications   Medication Sig Start Date End Date Taking? Authorizing Provider  albuterol (PROVENTIL HFA;VENTOLIN HFA) 108 (90 BASE) MCG/ACT inhaler Inhale 2 puffs into the lungs 3 (three) times daily as needed for wheezing or shortness of breath.    Yes Historical Provider, MD  digoxin (LANOXIN) 0.25 MG tablet Take 250 mcg by mouth daily.     Yes Historical Provider, MD  diltiazem (CARDIZEM CD) 300 MG 24 hr capsule Take 300 mg by mouth daily.     Yes Historical Provider, MD  FLUoxetine (PROZAC) 40 MG capsule Take 40 mg by mouth daily.     Yes Historical Provider, MD  Hypromellose (GENTEAL OP) Place 1 drop into both eyes daily as needed (scratchy eyes).   Yes Historical Provider, MD  lenalidomide (REVLIMID) 15 MG capsule Take 1 capsule (15 mg total) by mouth See admin instructions. Take for 21 days, hold for 7 days and then repeat 01/19/14  Yes Wyatt Portela, MD  loperamide (IMODIUM) 2 MG capsule Take 2 mg by mouth 4 (four) times daily as needed for diarrhea or loose stools.    Yes Historical  Provider, MD  niacin (NIASPAN) 500 MG CR tablet Take 1,500 mg by mouth at bedtime.    Yes Historical Provider, MD  omeprazole (PRILOSEC OTC) 20 MG tablet Take 20 mg by mouth daily.   Yes Historical Provider, MD  oxyCODONE-acetaminophen (PERCOCET/ROXICET) 5-325 MG per tablet Take 1 tablet by mouth every 4 (four) hours as needed (pain).   Yes Historical Provider, MD  potassium chloride SA (K-DUR,KLOR-CON) 20 MEQ tablet Take 60  mEq by mouth at bedtime.   Yes Historical Provider, MD  pravastatin (PRAVACHOL) 40 MG tablet Take 40 mg by mouth at bedtime.    Yes Historical Provider, MD  losartan-hydrochlorothiazide (HYZAAR) 100-25 MG per tablet Take 1 tablet by mouth See admin instructions. Take 1 tablet by mouth daily after BP goes back up to 140/90    Historical Provider, MD  warfarin (COUMADIN) 4 MG tablet Take 1 tablet (4 mg total) by mouth daily at 6 PM. 01/08/14   Jenetta Loges, PA-C    Inpatient Medications:  . antiseptic oral rinse  15 mL Mouth Rinse BID  . diltiazem  300 mg Oral Daily  . enoxaparin (LOVENOX) injection  40 mg Subcutaneous Q24H  . FLUoxetine  40 mg Oral Daily  . furosemide  20 mg Oral Daily  . losartan  50 mg Oral Daily  . niacin  1,500 mg Oral QHS  . pantoprazole  20 mg Oral Daily  . potassium chloride  20 mEq Oral Daily  . pravastatin  40 mg Oral q1800  . sodium chloride  3 mL Intravenous Q12H      Allergies: No Known Allergies  History   Social History  . Marital Status: Divorced    Spouse Name: N/A    Number of Children: N/A  . Years of Education: N/A   Occupational History  . Not on file.   Social History Main Topics  . Smoking status: Never Smoker   . Smokeless tobacco: Not on file  . Alcohol Use: 1.8 oz/week    3 Cans of beer per week  . Drug Use: No  . Sexual Activity: Not on file   Other Topics Concern  . Not on file   Social History Narrative  . No narrative on file     Family History  Problem Relation Age of Onset  . Cancer Mother     Unknown Type  . Cancer Father     Oral  . Cancer Son     Brain  . Rheum arthritis Mother      Review of Systems: General: negative for chills, fever, night sweats or weight changes. Positive for diminished appetite and generalized fatigue ENT: negative for rhinorrhea or epistaxis Cardiovascular: See history of present illness Dermatological: negative for rash Respiratory: negative for cough or wheezing GI: negative  for nausea, vomiting, diarrhea, bright red blood per rectum, melena, or hematemesis GU: no hematuria, urgency, or frequency Neurologic: negative for visual changes, syncope, headache, or dizziness Heme: no easy bruising or bleeding Endo: negative for excessive thirst, thyroid disorder, or flushing Musculoskeletal: Positive for bilateral knee pain, negative for myalgias  All other systems reviewed and are otherwise negative except as noted above.  Physical Exam: Blood pressure 91/62, pulse 70, temperature 98.5 F (36.9 C), temperature source Oral, resp. rate 16, height _0  (1.549 m), weight 163 lb 12.8 oz (74.299 kg), SpO2 94.00%. General: Pleasant, overweight elderly woman, in no acute distress. HEENT: Normocephalic, atraumatic, sclera non-icteric, no xanthomas, nares are without discharge.  Neck: Supple. Carotids 2+  without bruits. JVP normal Lungs: Clear anteriorly to auscultation without wheezes, rales, or rhonchi. Breathing is unlabored. Heart: Irregularly irregular with grade 3/6 harsh systolic murmur best heard at the left lower sternal border Abdomen: Soft, non-tender, non-distended with normoactive bowel sounds. No hepatomegaly. No rebound/guarding. No obvious abdominal masses. Back: No CVA tenderness Msk:  Strength and tone appear normal for age. Extremities: No clubbing, cyanosis, or edema.  Distal pedal pulses are 2+ and equal bilaterally. Neuro: CNII-XII intact, moves all extremities spontaneously. Strength intact and equal bilaterally Psych:  Responds to questions appropriately with a normal affect.    Labs: No results found for this basename: CKTOTAL, CKMB, TROPONINI,  in the last 72 hours Lab Results  Component Value Date   WBC 5.4 01/24/2014   HGB 9.3* 01/24/2014   HCT 31.0* 01/24/2014   MCV 95.1 01/24/2014   PLT 159 01/24/2014    Recent Labs Lab 01/21/14 1505  01/25/14 0410  NA 127*  < > 132*  K 4.7  < > 4.4  CL 96  < > 98  CO2 17*  < > 26  BUN 18  < > 15    CREATININE 0.64  < > 0.56  CALCIUM 7.9*  < > 8.0*  PROT 10.0*  --   --   BILITOT 1.5*  --   --   ALKPHOS 110  --   --   ALT 59*  --   --   AST 110*  --   --   GLUCOSE 98  < > 94  < > = values in this interval not displayed. No results found for this basename: CHOL, HDL, LDLCALC, TRIG   No results found for this basename: DDIMER    Radiology/Studies:  Dg Chest 2 View  01/21/2014   CLINICAL DATA:  Hemoptysis.  EXAM: CHEST  2 VIEW  COMPARISON:  Chest x-ray 01/19/2014, 01/06/2014, 02/02/2010.  FINDINGS: Mediastinum and hilar structures are normal. Cardiomegaly with increased pulmonary vascularity and interstitial prominence noted. These findings are consistent with congestive heart failure. No pleural effusion or pneumothorax. Right shoulder replacement. Degenerative changes thoracic spine and left shoulder.  IMPRESSION: 1. Congestive heart failure with pulmonary interstitial edema. 2. Right shoulder arthroplasty.   Electronically Signed   By: [name]  Register   On: 01/21/2014 19:24   Dg Chest 2 View  01/19/2014   CLINICAL DATA:  Cough and shortness of breath.  EXAM: CHEST  2 VIEW  COMPARISON:  Single view of the chest 01/06/2014.  FINDINGS: There is cardiomegaly and vascular congestion. Aeration is improved since the prior study. No pneumothorax or pleural effusion is identified. Reverse right shoulder arthroplasty is in place. There is extensive lucency about the glenoid component and the superior most screw does not appear anchored in bone.  IMPRESSION: Cardiomegaly and pulmonary vascular congestion without acute disease.  Status post reverse right shoulder arthroplasty. Marked loosening of the glenoid component which appears superiorly displaced is noted.   Electronically Signed   By: Inge Rise M.D.   On: 01/19/2014 16:09   Dg Chest 2 View  01/06/2014   CLINICAL DATA:  Preop for right shoulder surgery, shortness of breath and history of COPD  EXAM: CHEST  2 VIEW  COMPARISON:  Chest  x-ray of 02/02/2010  FINDINGS: There are prominent interstitial markings diffusely throughout the lungs with cardiomegaly most consistent with mild interstitial edema. No definite effusion is seen. Fracture of the right humeral neck is noted with degenerative change in the left shoulder.  IMPRESSION: Suspect  mild interstitial edema.   Electronically Signed   By: Ivar Drape M.D.   On: 01/06/2014 08:14   Dg Chest Port 1 View  01/06/2014   CLINICAL DATA:  Shortness of breath  EXAM: PORTABLE CHEST - 1 VIEW  COMPARISON:  Film from earlier in the same day  FINDINGS: Cardiac shadow remains enlarged. The degree of vascular congestion has increased significantly in the interval from the prior exam. New right basilar atelectatic changes are seen. No acute bony abnormality is noted. Postsurgical changes in the right shoulder are now seen.  IMPRESSION: Increasing congestive failure with associated right basilar atelectasis.  These results were called by telephone at the time of interpretation on 01/06/2014 at 10:56 AM to Amy the patients nurse, who verbally acknowledged these results.   Electronically Signed   By: Inez Catalina M.D.   On: 01/06/2014 10:57    EKG: Atrial fibrillation with nonspecific ST abnormality  Cardiac Studies: CXR: FINDINGS:  Mediastinum and hilar structures are normal. Cardiomegaly with  increased pulmonary vascularity and interstitial prominence noted.  These findings are consistent with congestive heart failure. No  pleural effusion or pneumothorax. Right shoulder replacement.  Degenerative changes thoracic spine and left shoulder.  IMPRESSION:  1. Congestive heart failure with pulmonary interstitial edema.  2. Right shoulder arthroplasty  2D Echo: Left ventricle: Posterior basal hypokinesis. The cavity size was normal. Wall thickness was increased in a pattern of mild LVH. Systolic function was normal. The estimated ejection fraction was in the range of 55% to  60%.  ------------------------------------------------------------------- Aortic valve: Severely calcified leaflets. Doppler: There was severe stenosis. Valve area (VTI): 0.42 cm^2. Indexed valve area (VTI): 0.23 cm^2/m^2. Valve area (Vmax): 0.53 cm^2. Indexed valve area (Vmax): 0.29 cm^2/m^2. Mean gradient (S): 44 mm Hg. Peak gradient (S): 78 mm Hg.  ------------------------------------------------------------------- Aorta: The aorta was normal, not dilated, and non-diseased.  ------------------------------------------------------------------- Mitral valve: Calcified annulus. Doppler: There was moderate regurgitation. Peak gradient (D): 8 mm Hg.  ------------------------------------------------------------------- Left atrium: The atrium was severely dilated.  ------------------------------------------------------------------- Atrial septum: No defect or patent foramen ovale was identified.  ------------------------------------------------------------------- Right ventricle: The cavity size was normal. Wall thickness was normal. Systolic function was normal.  ------------------------------------------------------------------- Pulmonic valve: Structurally normal valve. Cusp separation was normal. Doppler: Transvalvular velocity was within the normal range. There was mild regurgitation.  ------------------------------------------------------------------- Tricuspid valve: Structurally normal valve. Leaflet separation was normal. Doppler: Transvalvular velocity was within the normal range. There was mild regurgitation.  ------------------------------------------------------------------- Right atrium: The atrium was mildly dilated.  ------------------------------------------------------------------- Pericardium: The pericardium was normal in appearance.  RISK SCORES About the STS Risk Calculator Procedure: AV Replacement Risk of Mortality: 3.885% Morbidity or Mortality:  16.366% Long Length of Stay: 9.375% Short Length of Stay: 31.202% Permanent Stroke: 1.417%  Prolonged Ventilation: 10.164% DSW Infection: 0.236% Renal Failure: 3.272% Reoperation: 6.813%   ASSESSMENT AND PLAN:  79 year old woman with severe aortic stenosis and acute diastolic heart failure. She has multiple comorbid conditions including multiple myeloma with associated anemia, generalized weakness with recent fall and displaced shoulder fracture now status post surgery, and chronic atrial fibrillation.  Discussion: I reviewed the natural history of severe symptomatically aortic stenosis with the patient at length. We discussed the prognostic importance of associated congestive heart failure. This discussion was placed in the context of her specific comorbid medical conditions. We discussed specific treatment options, including palliative medical therapy, open surgical aortic valve replacement, and transcatheter aortic valve replacement. Her STS predicted risk of mortality grossly underestimates her surgical risk since  multiple myeloma and poor functional status are not included in the risk calculation. The patient will clearly require skilled nursing for rehabilitation after her current hospitalization. She is eager to participate in physical therapy and regain her strength. If she decides to pursue treatment of aortic stenosis, she understands that she will require multiple studies, including cardiac catheterization and CT angiography of the abdomen and pelvis as well as a cardiac CT scan. I think it is most prudent to see her back in the office for further discussion as an outpatient after she is discharged from skilled nursing. I will discuss her case with [name] who treats her for multiple myeloma. Also will review her case with the Multidisciplinary valve team. Will arrange a follow-up office visit in approximately 4 weeks. Thank you and please call if questions arise.  Time spent conducting  this evaluation exceeds 60 minutes greater than half of that time spent in discussion with the patient as outlined above.  Signed, Sherren Mocha MD 01/25/2014, 6:50 AM

## 2014-01-25 NOTE — Progress Notes (Signed)
CSW submitted clinicals to [facility] and is awaiting insurance authorization. CSW spoke to patient and patient's friend by bedside. Patient chose [facility] for SNF placement. CSW continues to follow patient for dc.  Jeanette Caprice, MSW, [place]

## 2014-01-26 ENCOUNTER — Telehealth: Payer: Self-pay | Admitting: Cardiovascular Disease

## 2014-01-26 ENCOUNTER — Telehealth: Payer: Self-pay | Admitting: Oncology

## 2014-01-26 NOTE — Telephone Encounter (Signed)
I advised 4 week follow-up. I think she needs at least a few weeks of rehab. If patient would like to come in sooner I will work her in but would favor a little more time for her to gain strength.

## 2014-01-26 NOTE — Telephone Encounter (Signed)
Brianna Holden (EC) contacting Dr Burt Knack and nurse to get a arranged a sooner appt for the pt. Pt was discharged recently from the hospital and Dr Burt Knack saw the pt.  Per the [facility], she is supposed to follow-up with Dr Burt Knack in 2 weeks for evaluation of her aortic valve stenosis.  EC states that the scheduler told her that there were no openings to see Dr Burt Knack until 4 weeks.  EC stated that the scheduler offered for pt to see an extender.  Per EC, clear instructions were given by Dr Burt Knack for pt to set up an appt with him in 2 weeks.  Informed EC that Dr Burt Knack and nurse are both out of the office today, but I will forward this message to them for further review and recommendation on scheduling pt.  EC verbalized understanding and agrees with this plan.

## 2014-01-26 NOTE — Telephone Encounter (Signed)
New Prob    Friend of pt has some questions regarding follow up appointment in 4 weeks. Nothing available at this time, offered PA but declined. Please call.

## 2014-01-26 NOTE — Telephone Encounter (Signed)
[place] (ec) called to schedule per hospital 2 wk f/u w/Dr. Alen Blew, pt fell and broke her hip. Mailing new apt schedule to pt...Marland KitchenMarland KitchenKJ

## 2014-01-26 NOTE — Telephone Encounter (Signed)
lmtcb

## 2014-01-27 NOTE — Telephone Encounter (Signed)
I spoke with [place] and made her aware of appointment.

## 2014-01-27 NOTE — Telephone Encounter (Signed)
Left message on machine for [place] to contact the office. I have scheduled the pt to see Dr Burt Knack on 02/18/14.

## 2014-02-09 ENCOUNTER — Ambulatory Visit: Payer: Medicare Other | Admitting: Oncology

## 2014-02-09 ENCOUNTER — Other Ambulatory Visit: Payer: Self-pay | Admitting: *Deleted

## 2014-02-09 DIAGNOSIS — C9000 Multiple myeloma not having achieved remission: Secondary | ICD-10-CM

## 2014-02-09 MED ORDER — LENALIDOMIDE 15 MG PO CAPS: ORAL_CAPSULE | ORAL | Status: AC

## 2014-02-09 NOTE — Telephone Encounter (Signed)
RECEIVED A FAX FROM [facility] CONCERNING A CONFIRMATION OF PT.'S PRESCRIPTION BENEFITS. PT. HAS A ZERO COPAY.

## 2014-02-11 ENCOUNTER — Telehealth: Payer: Self-pay | Admitting: Medical Oncology

## 2014-02-11 NOTE — Telephone Encounter (Signed)
Per MD, revlimid to be held. Triage nurse to call [facility] and inform them.

## 2014-02-11 NOTE — Telephone Encounter (Signed)
Call to patient's emergency contact, Smith Robert f/u information from triage dept that patient in hospital.  Spoke with Mrs. Merlyn Albert and she states patient was admitted to [facility] ICU on Wednesday for double pneumonia and was placed on a vent yesterday.   [facility] sent fax to office asking for verification of HOLD to Revlimid d/t patient's hospitalization.  MD inboxed.

## 2014-02-11 NOTE — Telephone Encounter (Signed)
CALLED [facility] TO PUT REVLIMID ON HOLD. VOICED UNDERSTANDING.

## 2014-02-18 ENCOUNTER — Ambulatory Visit: Payer: Medicare Other | Admitting: Cardiovascular Disease

## 2014-02-21 ENCOUNTER — Telehealth: Payer: Self-pay | Admitting: *Deleted

## 2014-02-21 NOTE — Telephone Encounter (Signed)
rec'd call from friend Keryn Nessler Robert, patient was admitted to the hospice house of [facility]. Future appt for 03/03/2014 cancelled per her request. Note to dr Hazeline Junker desk

## 2014-02-23 ENCOUNTER — Other Ambulatory Visit: Payer: Medicare Other

## 2014-02-23 ENCOUNTER — Ambulatory Visit: Payer: Medicare Other | Admitting: Oncology

## 2014-02-24 ENCOUNTER — Telehealth: Payer: Self-pay | Admitting: Medical Oncology

## 2014-02-24 NOTE — Telephone Encounter (Signed)
Ms. Ginnie Smart called to inform office that Ms. Lawhorne passed away yesterday @ 1417 at [facility]. Condolences extended to Ms. Stayley and family.  Ms. Ginnie Smart thanked everyone for Ms. Dodge's care and asked for me to inform Dr. Luisa Dago.  MD informed.

## 2014-03-15 DEATH — deceased

## 2014-04-06 ENCOUNTER — Ambulatory Visit: Payer: Medicare Other | Admitting: Cardiovascular Disease

## 2015-07-26 IMAGING — CR DG CHEST 1V PORT
1 series · 1 of 1 positions shown · non-contrast
Comparison: Film from earlier in the same day

CLINICAL DATA: Shortness of breath

EXAM:
PORTABLE CHEST - 1 VIEW

[AP]
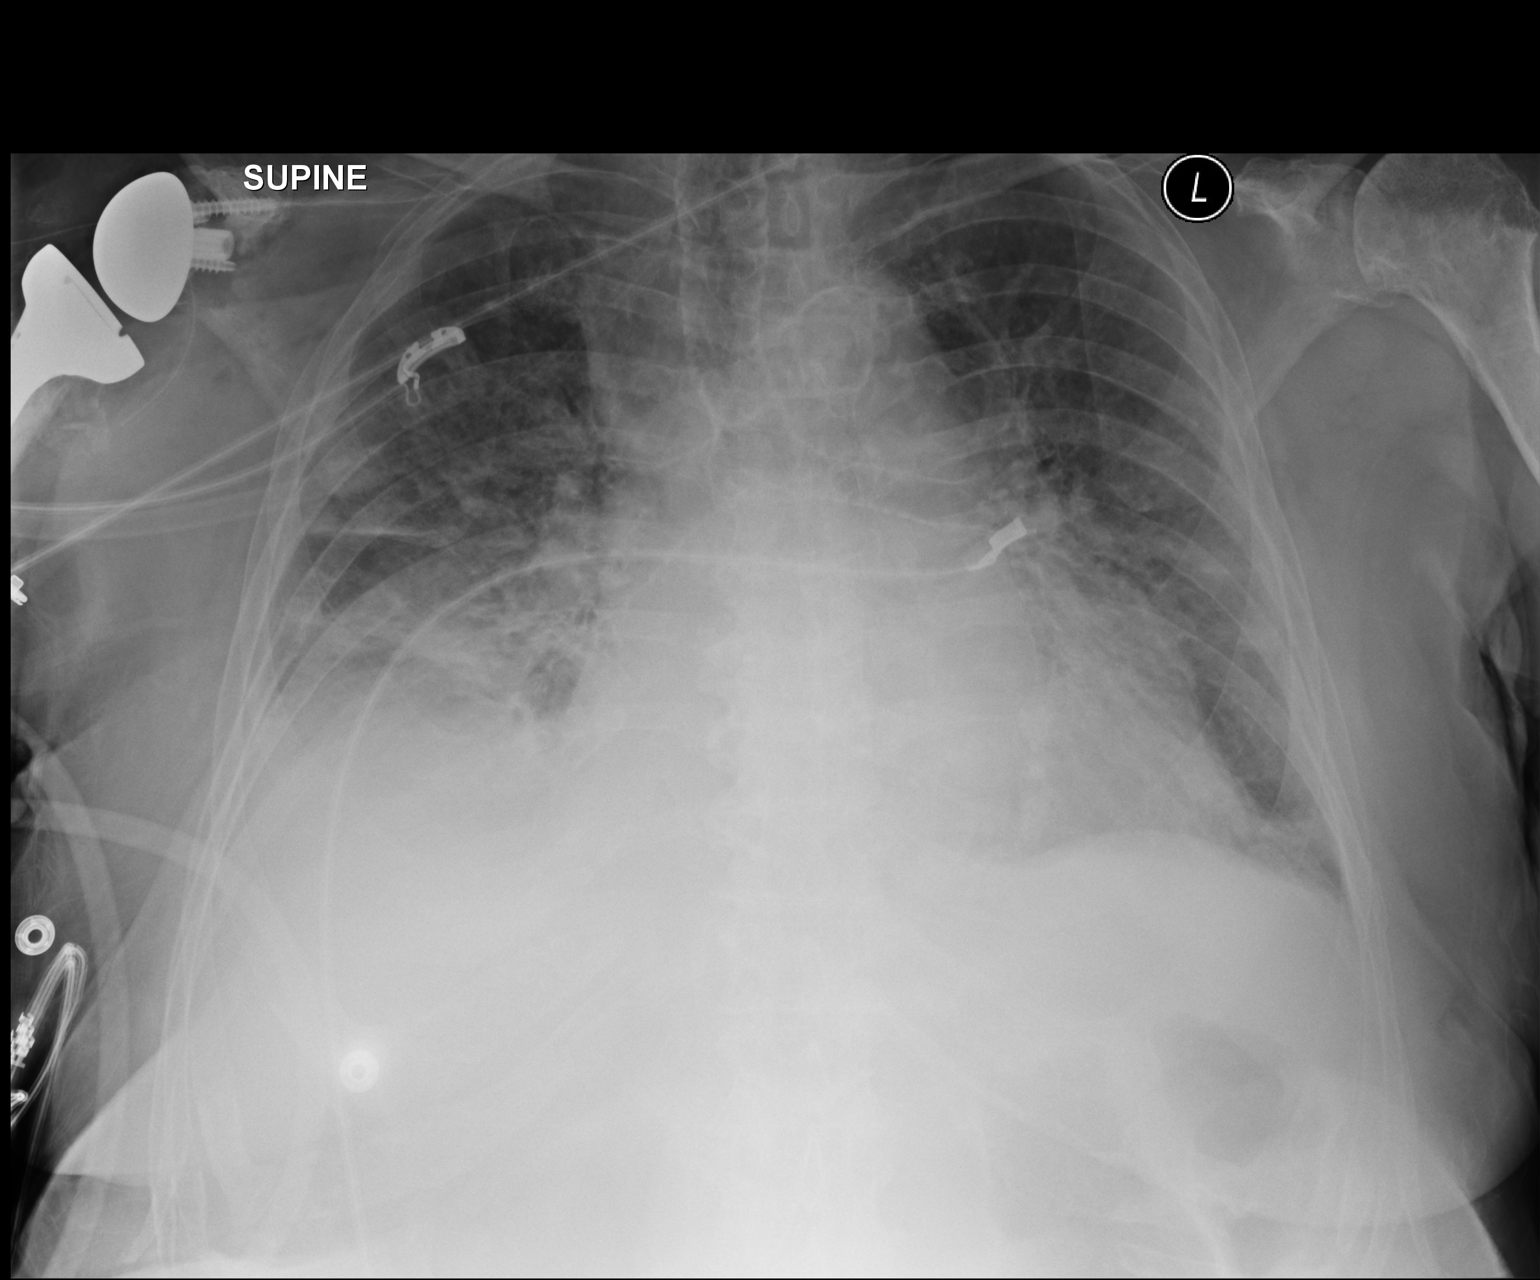

[1 of 1 positions shown; findings below may reference images not displayed]

FINDINGS: Cardiac shadow remains enlarged. The degree of vascular congestion
has increased significantly in the interval from the prior exam. New
right basilar atelectatic changes are seen. No acute bony
abnormality is noted. Postsurgical changes in the right shoulder are
now seen.
IMPRESSION: Increasing congestive failure with associated right basilar
atelectasis.

These results were called by telephone at the time of interpretation
on 01/06/2014 at [DATE] to Maria D the patients nurse, who verbally
acknowledged these results.

## 2015-07-26 IMAGING — CR DG CHEST 2V
2 series · 2 of 2 positions shown · non-contrast
Comparison: Chest x-ray of 02/02/2010

CLINICAL DATA: Preop for right shoulder surgery, shortness of
breath and history of COPD

EXAM:
CHEST  2 VIEW

[w chest pa]
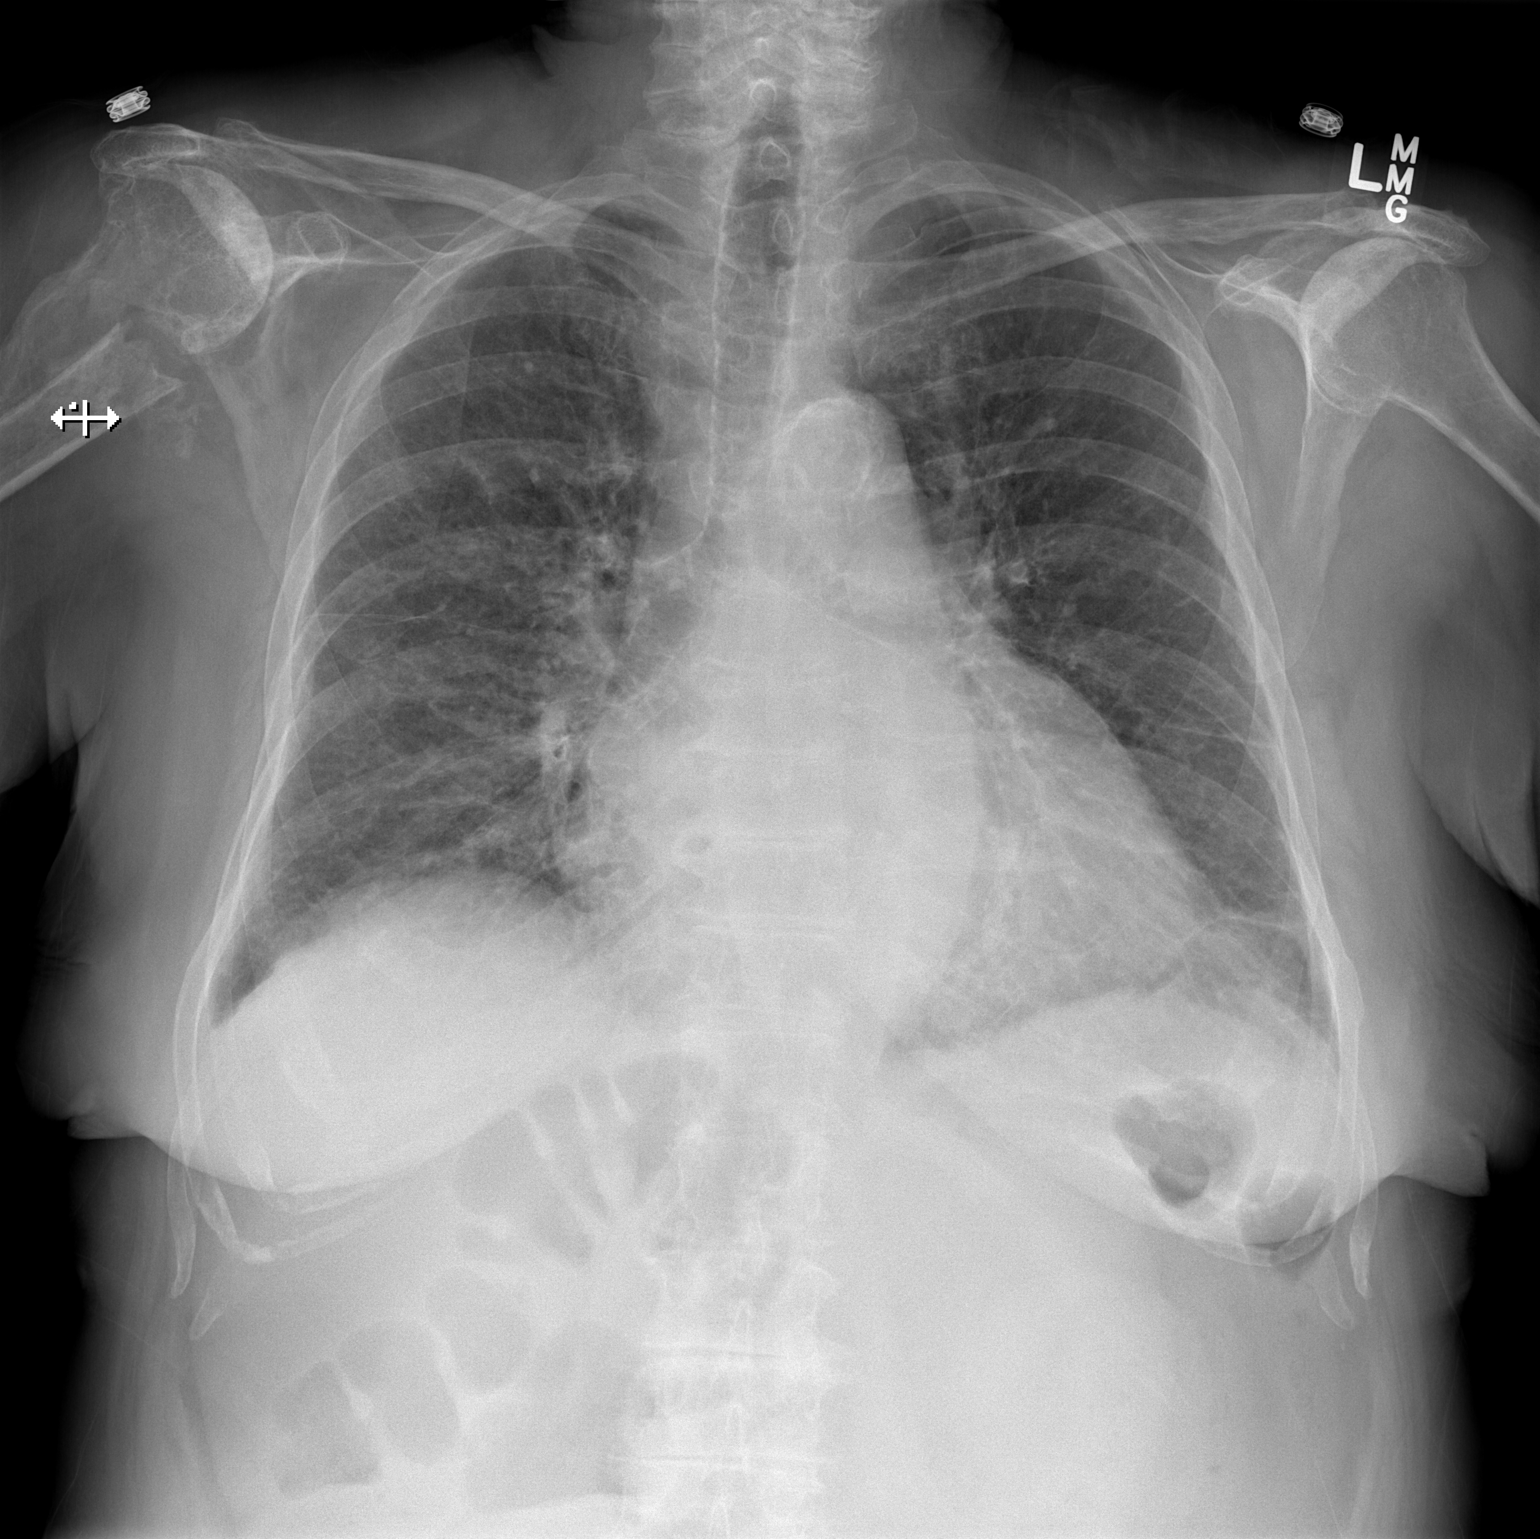

[w chest lat]
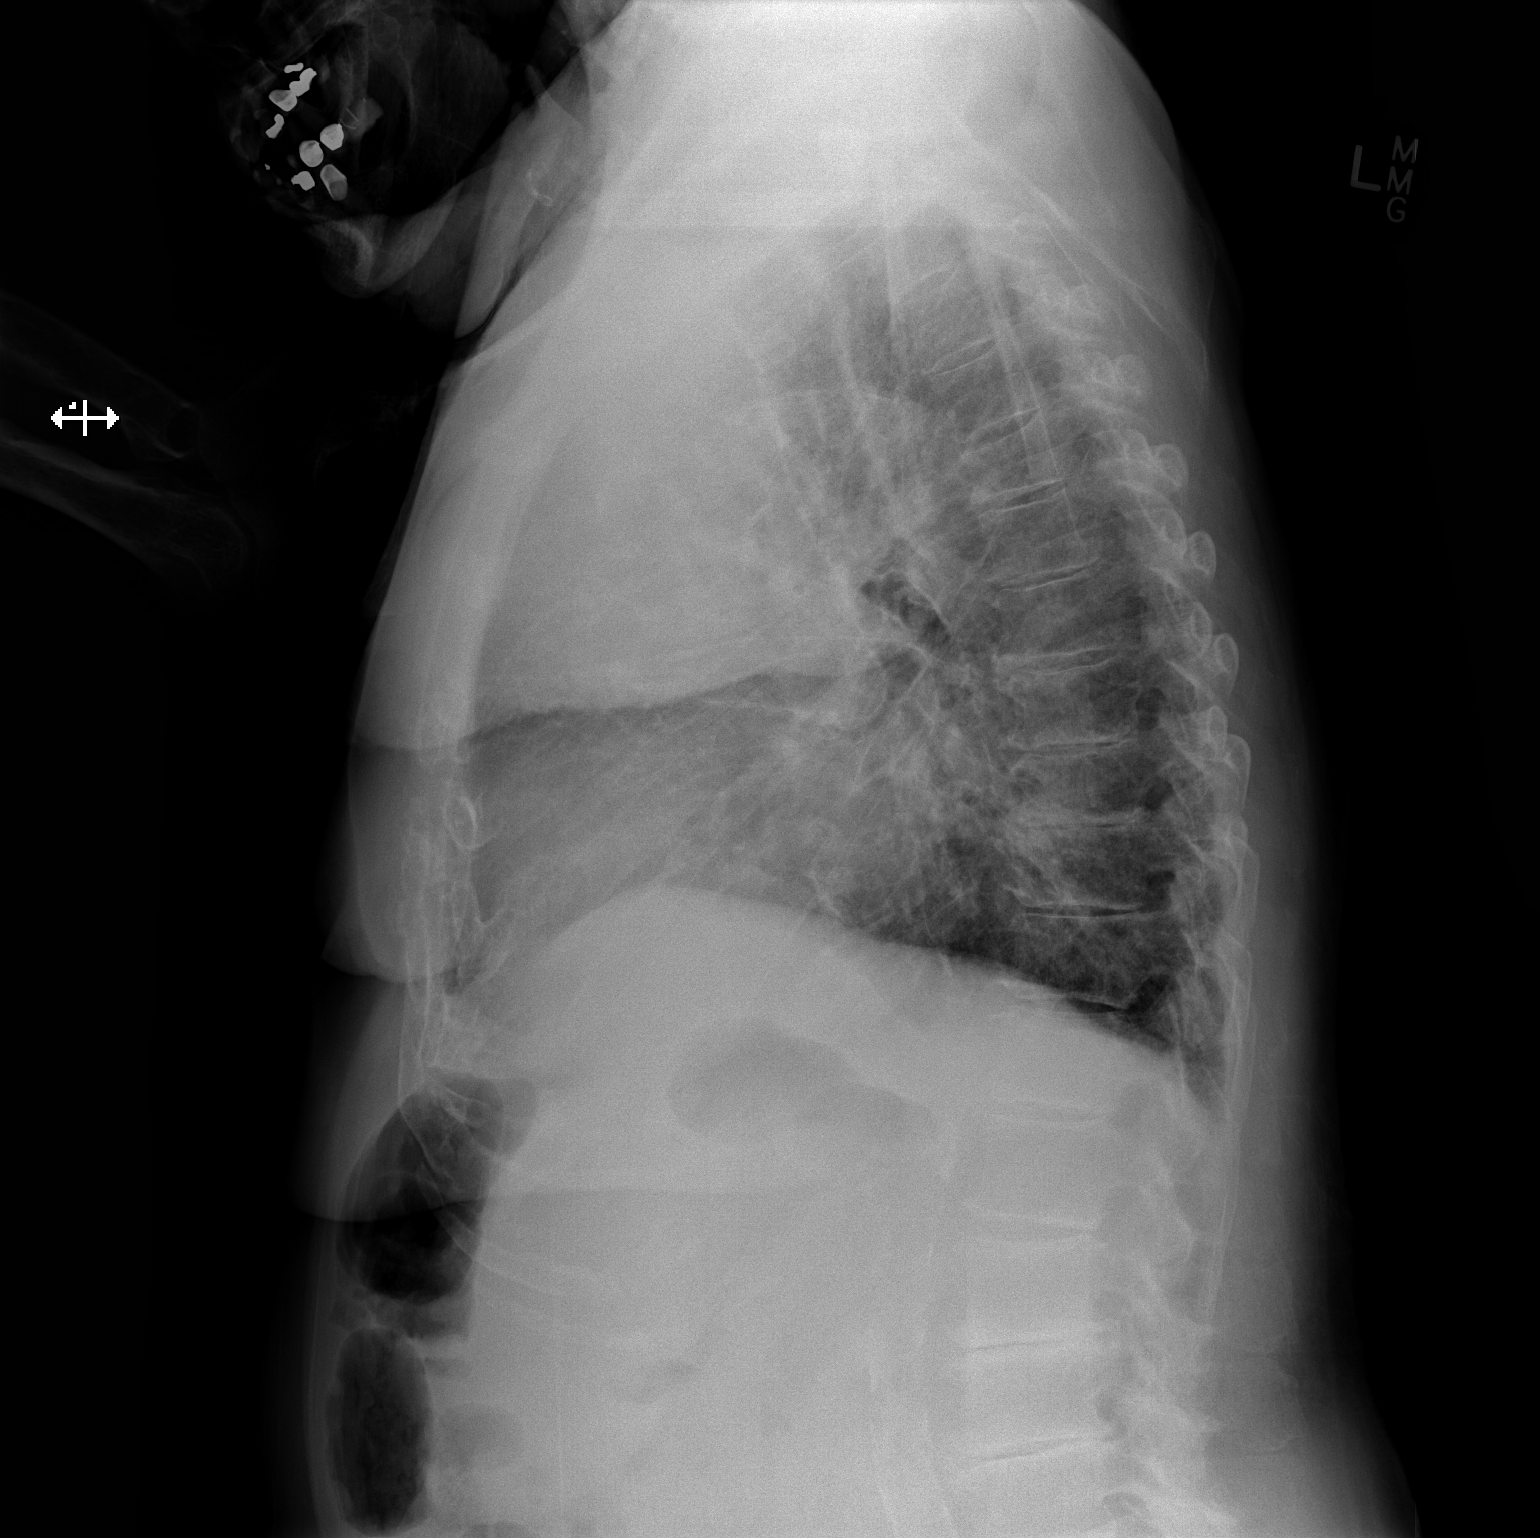

[2 of 2 positions shown; findings below may reference images not displayed]

FINDINGS: There are prominent interstitial markings diffusely throughout the
lungs with cardiomegaly most consistent with mild interstitial
edema. No definite effusion is seen. Fracture of the right humeral
neck is noted with degenerative change in the left shoulder.
IMPRESSION: Suspect mild interstitial edema.

## 2015-08-10 IMAGING — CR DG CHEST 2V
2 series · 2 of 2 positions shown · non-contrast
Comparison: Chest x-ray 01/19/2014, 01/06/2014, 02/02/2010.

CLINICAL DATA: Hemoptysis.

EXAM:
CHEST  2 VIEW

[w chest pa]
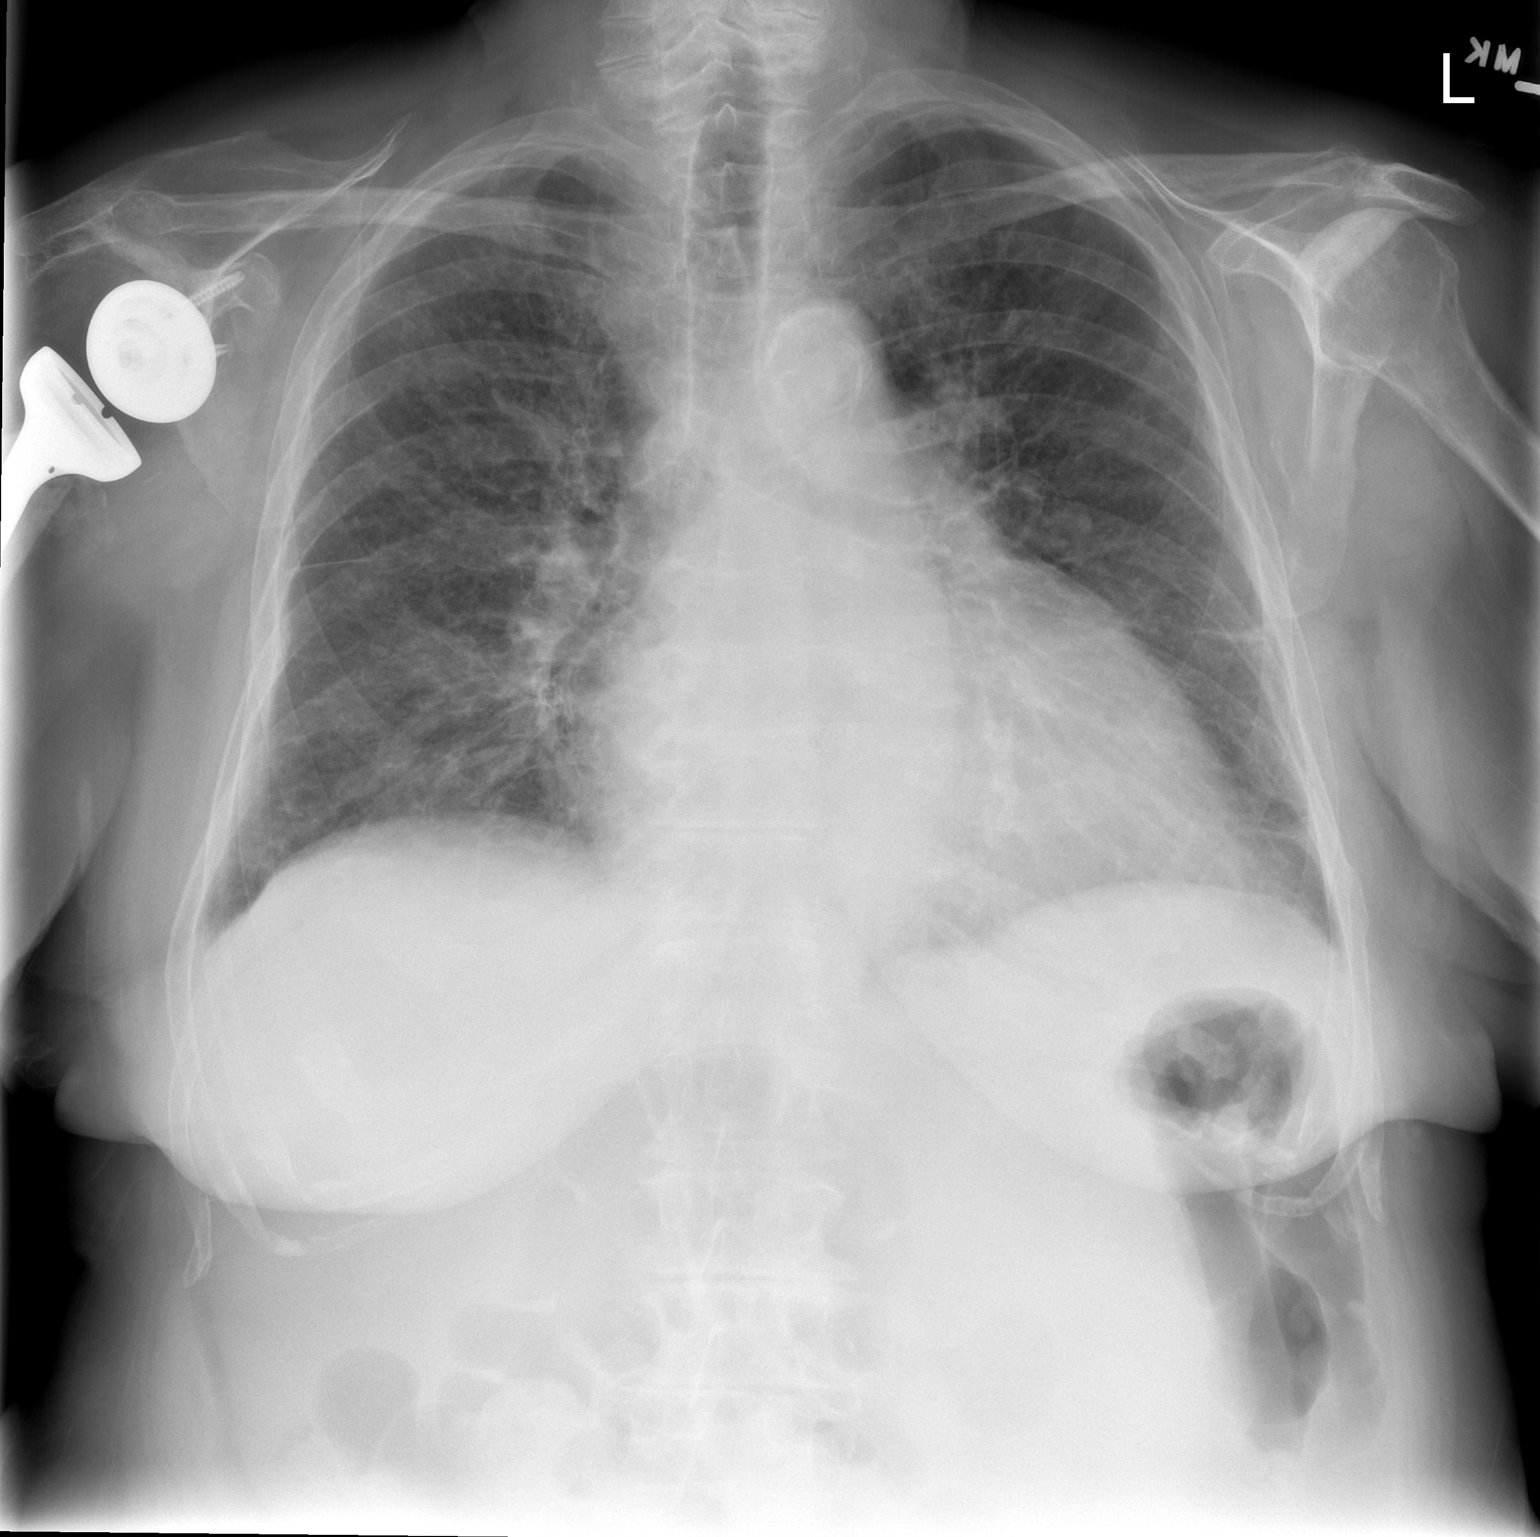

[w chest lat]
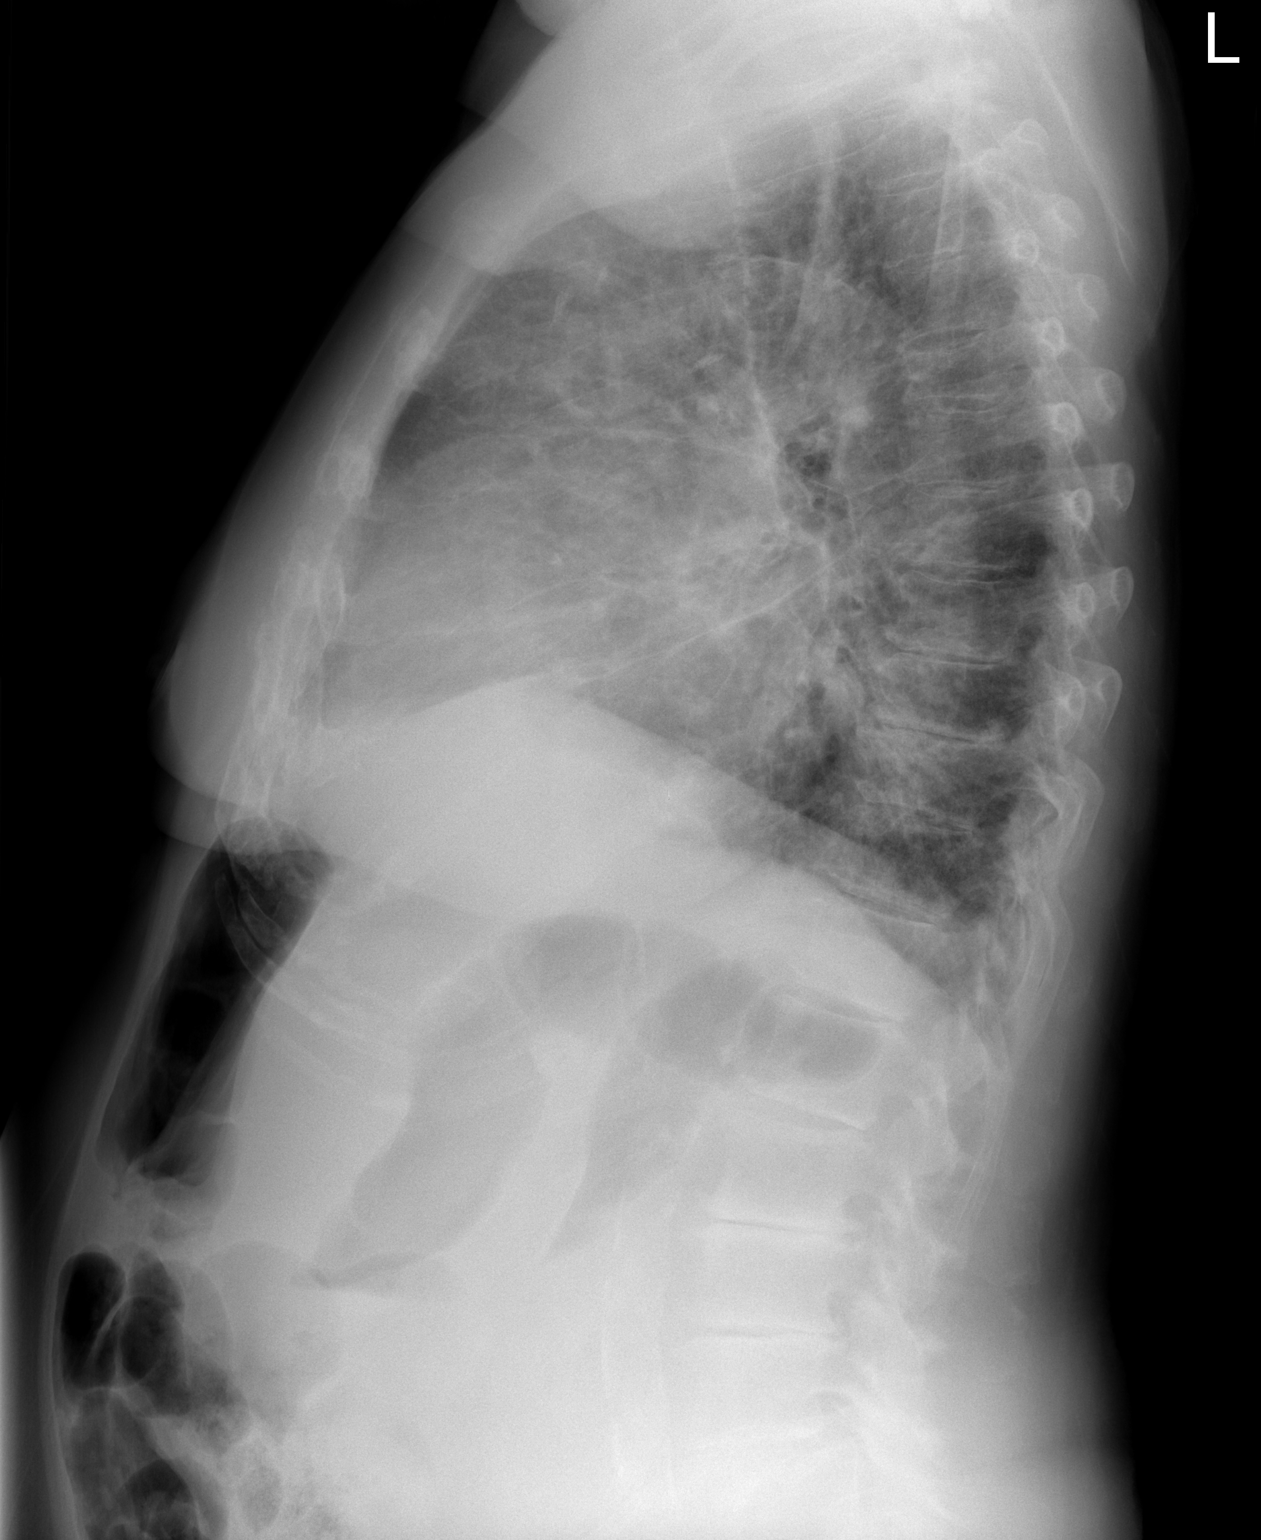

[2 of 2 positions shown; findings below may reference images not displayed]

FINDINGS: Mediastinum and hilar structures are normal. Cardiomegaly with
increased pulmonary vascularity and interstitial prominence noted.
These findings are consistent with congestive heart failure. No
pleural effusion or pneumothorax. Right shoulder replacement.
Degenerative changes thoracic spine and left shoulder.
IMPRESSION: 1. Congestive heart failure with pulmonary interstitial edema.
2. Right shoulder arthroplasty.
